# Patient Record
Sex: Female | Born: 1955 | Race: Black or African American | Hispanic: No | Marital: Married | Smoking: Never smoker
Health system: Southern US, Community
[De-identification: ages and names within clinical notes are randomized; demographics above are authoritative.]

## PROBLEM LIST (undated history)

## (undated) DIAGNOSIS — I10 Essential (primary) hypertension: Secondary | ICD-10-CM

---

## 2008-04-23 ENCOUNTER — Ambulatory Visit (HOSPITAL_COMMUNITY): Admission: RE | Admit: 2008-04-23 | Discharge: 2008-04-23 | Payer: Self-pay | Admitting: *Deleted

## 2015-07-14 ENCOUNTER — Other Ambulatory Visit (HOSPITAL_COMMUNITY): Payer: Self-pay | Admitting: Nurse Practitioner

## 2015-07-14 DIAGNOSIS — Z1231 Encounter for screening mammogram for malignant neoplasm of breast: Secondary | ICD-10-CM

## 2015-07-14 DIAGNOSIS — R102 Pelvic and perineal pain: Secondary | ICD-10-CM

## 2015-07-23 ENCOUNTER — Ambulatory Visit (HOSPITAL_COMMUNITY)
Admission: RE | Admit: 2015-07-23 | Discharge: 2015-07-23 | Disposition: A | Discharge status: Home / Self Care | Payer: BLUE CROSS/BLUE SHIELD | Source: Ambulatory Visit | Attending: Nurse Practitioner | Admitting: Nurse Practitioner

## 2015-07-23 ENCOUNTER — Ambulatory Visit (HOSPITAL_COMMUNITY): Payer: BLUE CROSS/BLUE SHIELD

## 2015-07-23 ENCOUNTER — Ambulatory Visit (HOSPITAL_COMMUNITY): Admission: RE | Admit: 2015-07-23 | Payer: BLUE CROSS/BLUE SHIELD | Source: Ambulatory Visit

## 2015-07-23 ENCOUNTER — Ambulatory Visit (HOSPITAL_COMMUNITY): Payer: Self-pay

## 2015-07-23 DIAGNOSIS — R102 Pelvic and perineal pain: Secondary | ICD-10-CM | POA: Diagnosis not present

## 2015-07-23 DIAGNOSIS — I77811 Abdominal aortic ectasia: Secondary | ICD-10-CM | POA: Insufficient documentation

## 2015-07-23 DIAGNOSIS — Z1231 Encounter for screening mammogram for malignant neoplasm of breast: Secondary | ICD-10-CM | POA: Diagnosis present

## 2015-07-23 DIAGNOSIS — Z90710 Acquired absence of both cervix and uterus: Secondary | ICD-10-CM | POA: Diagnosis not present

## 2015-07-23 DIAGNOSIS — R921 Mammographic calcification found on diagnostic imaging of breast: Secondary | ICD-10-CM | POA: Insufficient documentation

## 2015-07-27 ENCOUNTER — Other Ambulatory Visit: Payer: Self-pay | Admitting: Nurse Practitioner

## 2015-07-27 DIAGNOSIS — R928 Other abnormal and inconclusive findings on diagnostic imaging of breast: Secondary | ICD-10-CM

## 2015-08-20 ENCOUNTER — Ambulatory Visit
Admission: RE | Admit: 2015-08-20 | Discharge: 2015-08-20 | Disposition: A | Discharge status: Home / Self Care | Payer: BLUE CROSS/BLUE SHIELD | Source: Ambulatory Visit | Attending: Nurse Practitioner | Admitting: Nurse Practitioner

## 2015-08-20 ENCOUNTER — Other Ambulatory Visit: Payer: Self-pay | Admitting: Nurse Practitioner

## 2015-08-20 DIAGNOSIS — R928 Other abnormal and inconclusive findings on diagnostic imaging of breast: Secondary | ICD-10-CM

## 2017-10-17 ENCOUNTER — Encounter (HOSPITAL_COMMUNITY): Payer: Self-pay | Admitting: Emergency Medicine

## 2017-10-17 ENCOUNTER — Emergency Department (HOSPITAL_COMMUNITY): Payer: BLUE CROSS/BLUE SHIELD

## 2017-10-17 ENCOUNTER — Observation Stay (HOSPITAL_COMMUNITY)
Admission: EM | Admit: 2017-10-17 | Discharge: 2017-10-19 | Disposition: A | Discharge status: Home / Self Care | Payer: BLUE CROSS/BLUE SHIELD | Attending: Internal Medicine | Admitting: Internal Medicine

## 2017-10-17 ENCOUNTER — Other Ambulatory Visit: Payer: Self-pay

## 2017-10-17 DIAGNOSIS — I160 Hypertensive urgency: Principal | ICD-10-CM | POA: Insufficient documentation

## 2017-10-17 DIAGNOSIS — R209 Unspecified disturbances of skin sensation: Secondary | ICD-10-CM

## 2017-10-17 DIAGNOSIS — R42 Dizziness and giddiness: Secondary | ICD-10-CM | POA: Diagnosis present

## 2017-10-17 DIAGNOSIS — E876 Hypokalemia: Secondary | ICD-10-CM

## 2017-10-17 DIAGNOSIS — R112 Nausea with vomiting, unspecified: Secondary | ICD-10-CM | POA: Diagnosis not present

## 2017-10-17 DIAGNOSIS — R1110 Vomiting, unspecified: Secondary | ICD-10-CM

## 2017-10-17 DIAGNOSIS — E669 Obesity, unspecified: Secondary | ICD-10-CM | POA: Diagnosis not present

## 2017-10-17 DIAGNOSIS — R9431 Abnormal electrocardiogram [ECG] [EKG]: Secondary | ICD-10-CM | POA: Insufficient documentation

## 2017-10-17 DIAGNOSIS — Z6834 Body mass index (BMI) 34.0-34.9, adult: Secondary | ICD-10-CM | POA: Diagnosis not present

## 2017-10-17 DIAGNOSIS — I10 Essential (primary) hypertension: Secondary | ICD-10-CM

## 2017-10-17 DIAGNOSIS — I4891 Unspecified atrial fibrillation: Secondary | ICD-10-CM | POA: Insufficient documentation

## 2017-10-17 DIAGNOSIS — I161 Hypertensive emergency: Secondary | ICD-10-CM | POA: Diagnosis not present

## 2017-10-17 DIAGNOSIS — I480 Paroxysmal atrial fibrillation: Secondary | ICD-10-CM

## 2017-10-17 HISTORY — DX: Essential (primary) hypertension: I10

## 2017-10-17 LAB — MAGNESIUM: MAGNESIUM: 1.8 mg/dL (ref 1.7–2.4)

## 2017-10-17 LAB — I-STAT TROPONIN, ED: Troponin i, poc: 0.01 ng/mL (ref 0.00–0.08)

## 2017-10-17 LAB — DIFFERENTIAL
BASOS PCT: 0 %
Basophils Absolute: 0 10*3/uL (ref 0.0–0.1)
EOS ABS: 0.1 10*3/uL (ref 0.0–0.7)
EOS PCT: 1 %
Lymphocytes Relative: 26 %
Lymphs Abs: 2 10*3/uL (ref 0.7–4.0)
MONO ABS: 0.5 10*3/uL (ref 0.1–1.0)
MONOS PCT: 6 %
Neutro Abs: 5.1 10*3/uL (ref 1.7–7.7)
Neutrophils Relative %: 67 %

## 2017-10-17 LAB — CBC
HCT: 46 % (ref 36.0–46.0)
Hemoglobin: 14.3 g/dL (ref 12.0–15.0)
MCH: 29.3 pg (ref 26.0–34.0)
MCHC: 31.1 g/dL (ref 30.0–36.0)
MCV: 94.3 fL (ref 78.0–100.0)
PLATELETS: 226 10*3/uL (ref 150–400)
RBC: 4.88 MIL/uL (ref 3.87–5.11)
RDW: 13.8 % (ref 11.5–15.5)
WBC: 7.8 10*3/uL (ref 4.0–10.5)

## 2017-10-17 LAB — URINALYSIS, ROUTINE W REFLEX MICROSCOPIC
BILIRUBIN URINE: NEGATIVE
Glucose, UA: NEGATIVE mg/dL
HGB URINE DIPSTICK: NEGATIVE
Ketones, ur: NEGATIVE mg/dL
LEUKOCYTES UA: NEGATIVE
NITRITE: NEGATIVE
PH: 7 (ref 5.0–8.0)
Protein, ur: 100 mg/dL — AB
SPECIFIC GRAVITY, URINE: 1.01 (ref 1.005–1.030)

## 2017-10-17 LAB — COMPREHENSIVE METABOLIC PANEL
ALT: 15 U/L (ref 14–54)
AST: 20 U/L (ref 15–41)
Albumin: 3.9 g/dL (ref 3.5–5.0)
Alkaline Phosphatase: 74 U/L (ref 38–126)
Anion gap: 13 (ref 5–15)
BILIRUBIN TOTAL: 0.6 mg/dL (ref 0.3–1.2)
BUN: 8 mg/dL (ref 6–20)
CALCIUM: 9 mg/dL (ref 8.9–10.3)
CO2: 26 mmol/L (ref 22–32)
CREATININE: 0.64 mg/dL (ref 0.44–1.00)
Chloride: 101 mmol/L (ref 101–111)
GFR calc Af Amer: >60 mL/min (ref >60)
Glucose, Bld: 131 mg/dL — ABNORMAL HIGH (ref 65–99)
POTASSIUM: 2.7 mmol/L — AB (ref 3.5–5.1)
Sodium: 140 mmol/L (ref 135–145)
TOTAL PROTEIN: 7.4 g/dL (ref 6.5–8.1)

## 2017-10-17 LAB — PROTIME-INR
INR: 1.01
PROTHROMBIN TIME: 13.2 s (ref 11.4–15.2)

## 2017-10-17 LAB — LIPASE, BLOOD: Lipase: 30 U/L (ref 11–51)

## 2017-10-17 LAB — RAPID URINE DRUG SCREEN, HOSP PERFORMED
Amphetamines: NOT DETECTED
BARBITURATES: NOT DETECTED
Benzodiazepines: NOT DETECTED
COCAINE: NOT DETECTED
Opiates: NOT DETECTED
TETRAHYDROCANNABINOL: NOT DETECTED

## 2017-10-17 LAB — ETHANOL: Alcohol, Ethyl (B): <10 mg/dL (ref <10)

## 2017-10-17 LAB — TROPONIN I

## 2017-10-17 LAB — APTT: APTT: 26 s (ref 24–36)

## 2017-10-17 MED ORDER — LABETALOL HCL 5 MG/ML IV SOLN
20.0000 mg | INTRAVENOUS | Status: DC | PRN
  Administered 2017-10-17: 20 mg via INTRAVENOUS
  Filled 2017-10-17: qty 4

## 2017-10-17 MED ORDER — LOSARTAN POTASSIUM 50 MG PO TABS
50.0000 mg | ORAL_TABLET | Freq: Every day | ORAL | Status: DC
  Administered 2017-10-18 – 2017-10-19 (×2): 50 mg via ORAL
  Filled 2017-10-17 (×2): qty 1

## 2017-10-17 MED ORDER — LABETALOL HCL 5 MG/ML IV SOLN
0.5000 mg/min | INTRAVENOUS | Status: DC
  Administered 2017-10-17: 0.5 mg/min via INTRAVENOUS
  Filled 2017-10-17: qty 100

## 2017-10-17 MED ORDER — AMLODIPINE BESYLATE 5 MG PO TABS
5.0000 mg | ORAL_TABLET | Freq: Every day | ORAL | Status: DC
  Administered 2017-10-17 – 2017-10-18 (×2): 5 mg via ORAL
  Filled 2017-10-17 (×2): qty 1

## 2017-10-17 MED ORDER — ONDANSETRON HCL 4 MG/2ML IJ SOLN
4.0000 mg | Freq: Once | INTRAMUSCULAR | Status: AC
  Administered 2017-10-17: 4 mg via INTRAVENOUS

## 2017-10-17 MED ORDER — POTASSIUM CHLORIDE CRYS ER 20 MEQ PO TBCR
40.0000 meq | EXTENDED_RELEASE_TABLET | Freq: Once | ORAL | Status: AC
  Administered 2017-10-17: 40 meq via ORAL
  Filled 2017-10-17: qty 2

## 2017-10-17 MED ORDER — SODIUM CHLORIDE 0.9% FLUSH
3.0000 mL | Freq: Two times a day (BID) | INTRAVENOUS | Status: DC
  Administered 2017-10-17 – 2017-10-19 (×4): 3 mL via INTRAVENOUS

## 2017-10-17 MED ORDER — LOSARTAN POTASSIUM 50 MG PO TABS
100.0000 mg | ORAL_TABLET | Freq: Every day | ORAL | Status: DC
  Administered 2017-10-17: 100 mg via ORAL
  Filled 2017-10-17: qty 4

## 2017-10-17 MED ORDER — ACETAMINOPHEN 325 MG PO TABS: 650.0000 mg | ORAL_TABLET | Freq: Four times a day (QID) | ORAL | Status: DC | PRN

## 2017-10-17 MED ORDER — POTASSIUM CHLORIDE 10 MEQ/100ML IV SOLN
10.0000 meq | INTRAVENOUS | Status: AC
  Administered 2017-10-17 (×3): 10 meq via INTRAVENOUS
  Filled 2017-10-17 (×3): qty 100

## 2017-10-17 MED ORDER — ONDANSETRON HCL 4 MG/2ML IJ SOLN
INTRAMUSCULAR | Status: AC
  Administered 2017-10-17: 4 mg via INTRAVENOUS
  Filled 2017-10-17: qty 2

## 2017-10-17 MED ORDER — ONDANSETRON HCL 4 MG/2ML IJ SOLN
4.0000 mg | Freq: Once | INTRAMUSCULAR | Status: AC
  Administered 2017-10-17: 4 mg via INTRAVENOUS
  Filled 2017-10-17: qty 2

## 2017-10-17 MED ORDER — ACETAMINOPHEN 650 MG RE SUPP: 650.0000 mg | Freq: Four times a day (QID) | RECTAL | Status: DC | PRN

## 2017-10-17 MED ORDER — SODIUM CHLORIDE 0.9 % IV SOLN: 250.0000 mL | INTRAVENOUS | Status: DC | PRN

## 2017-10-17 MED ORDER — ONDANSETRON HCL 4 MG PO TABS: 4.0000 mg | ORAL_TABLET | Freq: Four times a day (QID) | ORAL | Status: DC | PRN

## 2017-10-17 MED ORDER — ENOXAPARIN SODIUM 60 MG/0.6ML ~~LOC~~ SOLN
50.0000 mg | SUBCUTANEOUS | Status: DC
  Administered 2017-10-17 – 2017-10-18 (×2): 50 mg via SUBCUTANEOUS
  Filled 2017-10-17 (×2): qty 0.6

## 2017-10-17 MED ORDER — SODIUM CHLORIDE 0.9% FLUSH: 3.0000 mL | INTRAVENOUS | Status: DC | PRN

## 2017-10-17 MED ORDER — ONDANSETRON HCL 4 MG/2ML IJ SOLN: 4.0000 mg | Freq: Four times a day (QID) | INTRAMUSCULAR | Status: DC | PRN

## 2017-10-17 NOTE — Progress Notes (Signed)
Pharmacy Clarification.  Lovenox 40 mg sq q24h ordered for DVT pxl in this 62 yo F with a BMI of 35.2 (based on height 37" and wt 102.5 kg).  Will change lovenox to 0.5 mg/kg q24h due to BMI >30.  Thanks,  [place] PharmD BCPS, BCPPS 10/17/2017 7:42 PM

## 2017-10-17 NOTE — ED Notes (Signed)
Date and time results received: 10/17/17 1140  Test: Potassium Critical Value: 2.7  Name of Provider Notified: Effie ShyWentz Orders Received? Or Actions Taken?: No new orders given.

## 2017-10-17 NOTE — ED Notes (Signed)
Pt states dizziness is some better. Family at bedside. EDP aware.

## 2017-10-17 NOTE — H&P (Addendum)
History and Physical    Lauren HaagensenMary G Oblinger WUX:[id number] DOB: 15-Mar-1956 DOA: 10/17/2017    PCP: Erasmo DownerStrader, Lindsey F, NP - NOTE: patient states she has no PCP Patient coming from: home Chief Complaint: dizziness and vomiting  HPI: Lauren Grimes is a 62 y.o. female with medical history of HTN for which she does not take medications presents with sudden onset dizziness and vomiting today. Last episode of vomiting was in the ER a little while ago. No abdominal pain and no diarrhea. No fevers. She states her dizziness has resolved in the ER as well. This is after she was started on a Labetalol infusion for severe HTN. She has has some numbness and tingling in both hands but no other neurological symptoms such as visual, speech issues or focal weakness. No headache. She was tole many years ago that she has HTN but does not have a PCP and does not take medications.   ED Course:  BP 230/127- improved to 150/83 on labetalol infusion K 2.7  Review of Systems:  All other systems reviewed and apart from HPI, are negative.  Past Medical History:  Diagnosis Date  . Hypertension     History reviewed. No pertinent surgical history.  Social History:   reports that  has never smoked. she has never used smokeless tobacco. She reports that she does not drink alcohol or use drugs.  Not on File  History reviewed. No pertinent family history.   Prior to Admission medications   Not on File    Physical Exam: Wt Readings from Last 3 Encounters:  10/17/17 102.5 kg (226 lb)   Vitals:   10/17/17 1545 10/17/17 1615 10/17/17 1645 10/17/17 1700  BP: (!) 162/83 (!) 159/87 (!) 168/88 (!) 159/92  Pulse: 77 81 79 82  Resp: 17 17 13 14   Temp:      SpO2: 94% 93% 96% 96%  Weight:      Height:          Constitutional: NAD, calm, comfortable Eyes: PERTLA, lids and conjunctivae normal ENMT: Mucous membranes are moist. Posterior pharynx clear of any exudate or lesions. Normal dentition.  Neck: normal,  supple, no masses, no thyromegaly Respiratory: clear to auscultation bilaterally, no wheezing, no crackles. Normal respiratory effort. No accessory muscle use.  Cardiovascular: S1 & S2 heard, regular rate and rhythm, no murmurs / rubs / gallops. No extremity edema. 2+ pedal pulses. No carotid bruits.  Abdomen: No distension, no tenderness, no masses palpated. No hepatosplenomegaly. Bowel sounds normal.  Musculoskeletal: no clubbing / cyanosis. No joint deformity upper and lower extremities. Good ROM, no contractures. Normal muscle tone.  Skin: no rashes, lesions, ulcers. No induration Neurologic: CN 2-12 grossly intact. Sensation intact, DTR normal. Strength 5/5 in all 4 limbs.  Psychiatric: Normal judgment and insight. Alert and oriented x 3. Normal mood.     Labs on Admission: I have personally reviewed following labs and imaging studies  CBC: Recent Labs  Lab 10/17/17 1054 10/17/17 1100  WBC 7.8  --   NEUTROABS  --  5.1  HGB 14.3  --   HCT 46.0  --   MCV 94.3  --   PLT 226  --    Basic Metabolic Panel: Recent Labs  Lab 10/17/17 1054 10/17/17 1654  NA 140  --   K 2.7*  --   CL 101  --   CO2 26  --   GLUCOSE 131*  --   BUN 8  --   CREATININE 0.64  --  CALCIUM 9.0  --   MG  --  1.8   GFR: Estimated Creatinine Clearance: 91.7 mL/min (by C-G formula based on SCr of 0.64 mg/dL). Liver Function Tests: Recent Labs  Lab 10/17/17 1054  AST 20  ALT 15  ALKPHOS 74  BILITOT 0.6  PROT 7.4  ALBUMIN 3.9   Recent Labs  Lab 10/17/17 1054  LIPASE 30   No results for input(s): AMMONIA in the last 168 hours. Coagulation Profile: Recent Labs  Lab 10/17/17 1100  INR 1.01   Cardiac Enzymes: No results for input(s): CKTOTAL, CKMB, CKMBINDEX, TROPONINI in the last 168 hours. BNP (last 3 results) No results for input(s): PROBNP in the last 8760 hours. HbA1C: No results for input(s): HGBA1C in the last 72 hours. CBG: No results for input(s): GLUCAP in the last 168  hours. Lipid Profile: No results for input(s): CHOL, HDL, LDLCALC, TRIG, CHOLHDL, LDLDIRECT in the last 72 hours. Thyroid Function Tests: No results for input(s): TSH, T4TOTAL, FREET4, T3FREE, THYROIDAB in the last 72 hours. Anemia Panel: No results for input(s): VITAMINB12, FOLATE, FERRITIN, TIBC, IRON, RETICCTPCT in the last 72 hours. Urine analysis:    Component Value Date/Time   COLORURINE STRAW (A) 10/17/2017 1040   APPEARANCEUR CLEAR 10/17/2017 1040   LABSPEC 1.010 10/17/2017 1040   PHURINE 7.0 10/17/2017 1040   GLUCOSEU NEGATIVE 10/17/2017 1040   HGBUR NEGATIVE 10/17/2017 1040   BILIRUBINUR NEGATIVE 10/17/2017 1040   KETONESUR NEGATIVE 10/17/2017 1040   PROTEINUR 100 (A) 10/17/2017 1040   NITRITE NEGATIVE 10/17/2017 1040   LEUKOCYTESUR NEGATIVE 10/17/2017 1040   Sepsis Labs: @LABRCNTIP (procalcitonin:4,lacticidven:4) )No results found for this or any previous visit (from the past 240 hour(s)).   Radiological Exams on Admission: Ct Head Wo Contrast  Result Date: 10/17/2017 CLINICAL DATA:  Dizziness and nausea beginning this morning. EXAM: CT HEAD WITHOUT CONTRAST TECHNIQUE: Contiguous axial images were obtained from the base of the skull through the vertex without intravenous contrast. COMPARISON:  None. FINDINGS: Brain: There is no evidence of acute infarct, intracranial hemorrhage, mass, midline shift, or extra-axial fluid collection. The ventricles and sulci are normal. Vascular: Calcified atherosclerosis at the skull base. No hyperdense vessel. Skull: No fracture or focal osseous lesion. Sinuses/Orbits: Mild left ethmoid air cell mucosal thickening. Clear mastoid air cells. Unremarkable orbits. Other: None. IMPRESSION: No evidence of acute intracranial abnormality. Unremarkable CT appearance of the brain. Electronically Signed   By: Sebastian Ache M.D.   On: 10/17/2017 12:12   Dg Chest Port 1 View  Result Date: 10/17/2017 CLINICAL DATA:  Hypertension with dizziness and  vomiting EXAM: PORTABLE CHEST 1 VIEW COMPARISON:  None. FINDINGS: The heart size and mediastinal contours are within normal limits. Both lungs are clear. The visualized skeletal structures are unremarkable. IMPRESSION: No active disease. Electronically Signed   By: Marlan Palau M.D.   On: 10/17/2017 11:29    EKG: Independently reviewed. NSR, t wave inversions in III, aVF, V4-V6  Assessment/Plan Principal Problem:   Hypertensive emergency - chronically untreated HTN - currently on a Labetalol infusion - will start Losartan and Norvasc and ask RN to wean Labetalol - goal BP should be about 160-180 and not below - have had a discussion with patient and husband about the complications of untreated HTN and the necessity to have a PCP and take medications for HTN appropriately - dietician consult  Active Problems:   Vomiting - due to HTN VS Viral - abdominal exam is benign-normal bowel sounds-  no diarrhea - PRN Zofran- full  liquids    Hypokalemia - ? Due to vomiting - replace and follow - Mg normal  Abnormal EKG - ? Due to HTN emergency/ cardiac strain  - no old EKG to compare with - first troponin is normal- will trend- vomiting can be an anginal equivalent  - will obtain an ECHO    Obesity -Body mass index is 34.87 kg/m. - check A1c as well    DVT prophylaxis: Lovenox  Code Status: DNR  Family Communication: husband  Disposition Plan: telemetry   Consults called: none  Admission status: observation    Calvert Cantor MD [facility] Pager: [email or web address] Password [email or web address] 7PM-7AM, please contact night-coverage   10/17/2017, 6:46 PM

## 2017-10-17 NOTE — ED Provider Notes (Signed)
[facility] EMERGENCY DEPARTMENT Provider Note   CSN: [id number] Arrival date & time: 10/17/17  0946     History   Chief Complaint Chief Complaint  Patient presents with  . Dizziness    HPI Lauren Grimes is a 62 y.o. female.  She awoke this morning with dizziness, and later vomited.  The dizziness is described as a spinning sensation.  She also had transient headache, which has resolved.  No head trauma.  No other preceding symptoms.  She states that she was well yesterday.  She has not taken her blood pressure medications in over 1 year.  She denies chest pain, shortness of breath, focal weakness or paresthesia.  She came here by private vehicle.  There are no other known modifying factors.  HPI  Past Medical History:  Diagnosis Date  . Hypertension     There are no active problems to display for this patient.   History reviewed. No pertinent surgical history.  OB History    No data available       Home Medications    Prior to Admission medications   Not on File    Family History History reviewed. No pertinent family history.  Social History Social History   Tobacco Use  . Smoking status: Never Smoker  . Smokeless tobacco: Never Used  Substance Use Topics  . Alcohol use: No    Frequency: Never  . Drug use: No     Allergies   Patient has no allergy information on record.   Review of Systems Review of Systems  All other systems reviewed and are negative.    Physical Exam Updated Vital Signs BP (!) 162/83   Pulse 77   Temp 98 F (36.7 C)   Resp 17   Ht 5' 7.5" (1.715 m)   Wt 102.5 kg (226 lb)   SpO2 94%   BMI 34.87 kg/m   Physical Exam  Constitutional: She is oriented to person, place, and time. She appears well-developed.  HENT:  Head: Normocephalic and atraumatic.  Eyes: Conjunctivae and EOM are normal. Pupils are equal, round, and reactive to light.  Neck: Normal range of motion and phonation normal. Neck supple.    Cardiovascular: Normal rate and regular rhythm.  Pulmonary/Chest: Effort normal and breath sounds normal. She exhibits no tenderness.  Abdominal: Soft. She exhibits no distension. There is no tenderness. There is no guarding.  Musculoskeletal: Normal range of motion. She exhibits edema (1+ bilateral lower legs).  Neurological: She is alert and oriented to person, place, and time. She exhibits normal muscle tone.  No dysarthria, or aphasia.  No ataxia.  Skin: Skin is warm and dry.  Psychiatric: She has a normal mood and affect. Her behavior is normal. Judgment and thought content normal.  Nursing note and vitals reviewed.    ED Treatments / Results  Labs (all labs ordered are listed, but only abnormal results are displayed) Labs Reviewed  COMPREHENSIVE METABOLIC PANEL - Abnormal; Notable for the following components:      Result Value   Potassium 2.7 (*)    Glucose, Bld 131 (*)    All other components within normal limits  URINALYSIS, ROUTINE W REFLEX MICROSCOPIC - Abnormal; Notable for the following components:   Color, Urine STRAW (*)    Protein, ur 100 (*)    Bacteria, UA RARE (*)    Squamous Epithelial / LPF 0-5 (*)    All other components within normal limits  LIPASE, BLOOD  CBC  ETHANOL  PROTIME-INR  APTT  DIFFERENTIAL  RAPID URINE DRUG SCREEN, HOSP PERFORMED  I-STAT TROPONIN, ED    EKG  EKG Interpretation  Date/Time:  Wednesday October 17 2017 15:23:56 EST Ventricular Rate:  81 PR Interval:    QRS Duration: 87 QT Interval:  394 QTC Calculation: 458 R Axis:   3 Text Interpretation:  Sinus rhythm Nonspecific T abnormalities, diffuse leads Borderline ST elevation, lateral leads Since last tracing rate slower and now in sinus rhythym, and nonspecific t wave abnormality Confirmed by Mancel Bale [phone]) on 10/17/2017 4:25:45 PM      This patients CHA2DS2-VASc Score and unadjusted Ischemic Stroke Rate (% per year) is equal to 2.2 % stroke rate/year from a score  of 2  Above score calculated as 1 point each if present [CHF, HTN, DM, Vascular=MI/PAD/Aortic Plaque, Age if 65-74, or Female] Above score calculated as 2 points each if present [Age > 75, or Stroke/TIA/TE]    Radiology Ct Head Wo Contrast  Result Date: 10/17/2017 CLINICAL DATA:  Dizziness and nausea beginning this morning. EXAM: CT HEAD WITHOUT CONTRAST TECHNIQUE: Contiguous axial images were obtained from the base of the skull through the vertex without intravenous contrast. COMPARISON:  None. FINDINGS: Brain: There is no evidence of acute infarct, intracranial hemorrhage, mass, midline shift, or extra-axial fluid collection. The ventricles and sulci are normal. Vascular: Calcified atherosclerosis at the skull base. No hyperdense vessel. Skull: No fracture or focal osseous lesion. Sinuses/Orbits: Mild left ethmoid air cell mucosal thickening. Clear mastoid air cells. Unremarkable orbits. Other: None. IMPRESSION: No evidence of acute intracranial abnormality. Unremarkable CT appearance of the brain. Electronically Signed   By: Sebastian Ache M.D.   On: 10/17/2017 12:12   Dg Chest Port 1 View  Result Date: 10/17/2017 CLINICAL DATA:  Hypertension with dizziness and vomiting EXAM: PORTABLE CHEST 1 VIEW COMPARISON:  None. FINDINGS: The heart size and mediastinal contours are within normal limits. Both lungs are clear. The visualized skeletal structures are unremarkable. IMPRESSION: No active disease. Electronically Signed   By: Marlan Palau M.D.   On: 10/17/2017 11:29    Procedures .Critical Care Performed by: Mancel Bale, MD Authorized by: Mancel Bale, MD   Critical care provider statement:    Critical care time (minutes):  95   Critical care start time:  10/17/2017 10:00 AM   Critical care end time:  10/17/2017 4:36 PM   Critical care was necessary to treat or prevent imminent or life-threatening deterioration of the following conditions:  Circulatory failure   Critical care was time  spent personally by me on the following activities:  Blood draw for specimens, development of treatment plan with patient or surrogate, discussions with consultants, obtaining history from patient or surrogate, examination of patient, evaluation of patient's response to treatment, ordering and performing treatments and interventions, ordering and review of laboratory studies, ordering and review of radiographic studies, pulse oximetry, re-evaluation of patient's condition and review of old charts   (including critical care time)  Medications Ordered in ED Medications  labetalol (NORMODYNE,TRANDATE) injection 20 mg (20 mg Intravenous Given 10/17/17 1140)  potassium chloride 10 mEq in 100 mL IVPB (10 mEq Intravenous New Bag/Given 10/17/17 1517)  ondansetron (ZOFRAN) injection 4 mg (4 mg Intravenous Given 10/17/17 1046)  potassium chloride SA (K-DUR,KLOR-CON) CR tablet 40 mEq (40 mEq Oral Given 10/17/17 1220)     Initial Impression / Assessment and Plan / ED Course  I have reviewed the triage vital signs and the nursing notes.  Pertinent labs & imaging  results that were available during my care of the patient were reviewed by me and considered in my medical decision making (see chart for details).  Clinical Course as of Oct 17 1634  Wed Oct 17, 2017  1052 She presents for evaluation of dizziness, which was first noticed this morning, and was associated with headache.  The dizziness made her vomit.  At this point the blood pressures improved to 173/90, prior 230/127.  Further treatment and evaluation ordered.  [EW]    Clinical Course User Index [EW] Mancel Bale, MD     Patient Vitals for the past 24 hrs:  BP Temp Pulse Resp SpO2 Height Weight  10/17/17 1545 (!) 162/83 - 77 17 94 % - -  10/17/17 1515 (!) 162/94 - 75 12 96 % - -  10/17/17 1430 (!) 146/87 - 77 14 96 % - -  10/17/17 1415 (!) 161/94 - 75 13 93 % - -  10/17/17 1345 (!) 151/95 - 80 15 98 % - -  10/17/17 1300 (!) 167/99 - (!) 55  13 95 % - -  10/17/17 1230 (!) 155/97 - 99 17 96 % - -  10/17/17 1218 (!) 156/104 - (!) 114 20 98 % - -  10/17/17 1215 - 98 F (36.7 C) - - - - -  10/17/17 1207 (!) 166/92 - 86 17 96 % - -  10/17/17 1145 137/89 - 71 11 94 % - -  10/17/17 1135 (!) 152/86 - - 17 - - -  10/17/17 1116 (!) 160/83 - 82 14 99 % - -  10/17/17 1100 (!) 168/96 - 100 15 96 % - -  10/17/17 1030 (!) 173/90 - 86 15 92 % - -  10/17/17 0958 - - - - - 5' 7.5" (1.715 m) 102.5 kg (226 lb)  10/17/17 0957 (!) 230/127 - (!) 104 18 95 % - -    4:27 PM Reevaluation with update and discussion. After initial assessment and treatment, an updated evaluation reveals patient states she feels better wants to go home however she is actively vomiting.  Encourage her to stay, and will contact the hospitalist for admission. Mancel Bale   4:37 PM-Consult complete with hospitalist. Patient case explained and discussed.  The agrees to admit patient for further evaluation and treatment. Call ended at 4:50 PM   Final Clinical Impressions(s) / ED Diagnoses   Final diagnoses:  Hypertensive urgency  Atrial fibrillation with RVR (HCC)  Hypokalemia   Hypertension, untreated, patient's choice.  New-onset atrial fibrillation, with version after treatment using labetalol.  Chads score elevated at 2.  She may need anticoagulants.  Evaluation consistent with hypertensive urgency, requiring hospitalization and further treatment, and the observed setting.  Nursing Notes Reviewed/ Care Coordinated Applicable Imaging Reviewed Interpretation of Laboratory Data incorporated into ED treatment   Plan: Admit  ED Discharge Orders    None       Mancel Bale, MD 10/17/17 1701

## 2017-10-17 NOTE — ED Notes (Signed)
Pt resting with husband at bedside. Waiting on hospitalist for admission.

## 2017-10-17 NOTE — ED Triage Notes (Signed)
Pt reports woke up this am around 800am with dizziness and emesis. Pt reports "room is spinning." pt denies any changes in vision, weakness, or numbness.

## 2017-10-17 NOTE — ED Notes (Signed)
Admitting doctor in room at this time. 

## 2017-10-18 ENCOUNTER — Observation Stay (HOSPITAL_COMMUNITY): Payer: BLUE CROSS/BLUE SHIELD

## 2017-10-18 ENCOUNTER — Observation Stay (HOSPITAL_BASED_OUTPATIENT_CLINIC_OR_DEPARTMENT_OTHER): Payer: BLUE CROSS/BLUE SHIELD

## 2017-10-18 DIAGNOSIS — I4891 Unspecified atrial fibrillation: Secondary | ICD-10-CM | POA: Diagnosis not present

## 2017-10-18 DIAGNOSIS — R209 Unspecified disturbances of skin sensation: Secondary | ICD-10-CM

## 2017-10-18 DIAGNOSIS — I10 Essential (primary) hypertension: Secondary | ICD-10-CM | POA: Diagnosis not present

## 2017-10-18 DIAGNOSIS — R9431 Abnormal electrocardiogram [ECG] [EKG]: Secondary | ICD-10-CM | POA: Diagnosis not present

## 2017-10-18 DIAGNOSIS — E876 Hypokalemia: Secondary | ICD-10-CM

## 2017-10-18 DIAGNOSIS — I160 Hypertensive urgency: Secondary | ICD-10-CM | POA: Diagnosis not present

## 2017-10-18 DIAGNOSIS — I161 Hypertensive emergency: Secondary | ICD-10-CM | POA: Diagnosis not present

## 2017-10-18 DIAGNOSIS — I480 Paroxysmal atrial fibrillation: Secondary | ICD-10-CM | POA: Diagnosis not present

## 2017-10-18 LAB — CBC
HEMATOCRIT: 41.7 % (ref 36.0–46.0)
HEMOGLOBIN: 12.8 g/dL (ref 12.0–15.0)
MCH: 28.8 pg (ref 26.0–34.0)
MCHC: 30.7 g/dL (ref 30.0–36.0)
MCV: 93.9 fL (ref 78.0–100.0)
Platelets: 223 10*3/uL (ref 150–400)
RBC: 4.44 MIL/uL (ref 3.87–5.11)
RDW: 14.1 % (ref 11.5–15.5)
WBC: 12 10*3/uL — ABNORMAL HIGH (ref 4.0–10.5)

## 2017-10-18 LAB — MAGNESIUM: Magnesium: 1.8 mg/dL (ref 1.7–2.4)

## 2017-10-18 LAB — BASIC METABOLIC PANEL
ANION GAP: 11 (ref 5–15)
BUN: 15 mg/dL (ref 6–20)
CO2: 26 mmol/L (ref 22–32)
Calcium: 9.1 mg/dL (ref 8.9–10.3)
Chloride: 103 mmol/L (ref 101–111)
Creatinine, Ser: 1.05 mg/dL — ABNORMAL HIGH (ref 0.44–1.00)
GFR calc Af Amer: >60 mL/min (ref >60)
GFR calc non Af Amer: 56 mL/min — ABNORMAL LOW (ref >60)
GLUCOSE: 98 mg/dL (ref 65–99)
POTASSIUM: 3.6 mmol/L (ref 3.5–5.1)
Sodium: 140 mmol/L (ref 135–145)

## 2017-10-18 LAB — ECHOCARDIOGRAM COMPLETE
Height: 67 in
Weight: 3649.6 oz

## 2017-10-18 LAB — TSH: TSH: 1.603 u[IU]/mL (ref 0.350–4.500)

## 2017-10-18 LAB — VITAMIN B12: VITAMIN B 12: 474 pg/mL (ref 180–914)

## 2017-10-18 LAB — TROPONIN I: Troponin I: <0.03 ng/mL (ref <0.03)

## 2017-10-18 LAB — HEMOGLOBIN A1C
HEMOGLOBIN A1C: 5 % (ref 4.8–5.6)
Mean Plasma Glucose: 96.8 mg/dL

## 2017-10-18 NOTE — Progress Notes (Addendum)
PROGRESS NOTE  Lauren HaagensenMary G Grimes ZOX:[id number] DOB: [birth year] DOA: 10/17/2017 PCP: Lauren DownerStrader, Lindsey F, NP  Brief History:  62 year old female with a history of hypertension not on any medications presented with acute onset of dizziness with associated nausea and vomiting that began on the morning of 10/17/2017 after she got up from bed to go to the bathroom.  In addition, the patient had some associated numbness and tingling in bilateral hands that lasted 3-5 seconds.  She denied any fevers, chills, chest pain, shortness of breath, headache, visual disturbance, focal extremity weakness, dysarthria.  There is not been any sick contacts.  She denies any abdominal pain, dysuria, hematuria.  The patient states that she had been previously diagnosed with hypertension by her primary care provider, but she quit taking her antihypertensive medications over 2 years ago when she ran out.  She has not followed up since that time.  In the emergency department, the patient was afebrile hemodynamically stable with a blood pressure of 230/107.  CT of the brain was unremarkable.  BMP and CBC were unremarkable except for potassium 2.7.  Assessment/Plan: Sensory disturbance -May be related to hypokalemia, vagal reaction -MRI brain--neg for stroke -CT brain negative -Serum B12 -TSH -Repeat potassium -check mag  Malignant hypertension/hypertensive urgency -Continue amlodipine and losartan which were started 10/17/2017  New Onset Atrial Fibrillation -spontaneously converted back to sinus on labetalol drip -Echo -consult cardiology  Abnormal EKG -Suspect a result of uncontrolled hypertension -Echocardiogram -personally reviewed EKG--sinus with T wave inversion V4-V6, 3, aVF  Nausea and vomiting -LFTs and lipase normal  -Urinalysis negative for pyuria -Resolved  Hypokalemia -Replete -Magnesium 1.8  Obesity -Body mass index is 34.87 kg/m.        Disposition Plan:   Home 1/17 or 1/18 if  stable Family Communication:   No Family at bedside--Total time spent 35 minutes.  Greater than 50% spent face to face counseling and coordinating care.   Consultants:  none  Code Status:  FULL  DVT Prophylaxis:  [place] Lovenox   Procedures: As Listed in Progress Note Above  Antibiotics: None    Subjective: Patient denies fevers, chills, headache, chest pain, dyspnea, nausea, vomiting, diarrhea, abdominal pain, dysuria, hematuria, hematochezia, and melena.   Objective: Vitals:   10/17/17 1945 10/17/17 2000 10/17/17 2036 10/17/17 2111  BP: (!) 165/85 (!) 154/82 (!) 147/76   Pulse: 78 77 78   Resp:  18    Temp:   99 Grimes (37.2 C)   TempSrc:   Oral   SpO2: 97% 98% 99% 92%  Weight:   103.5 kg (228 lb 1.6 oz)   Height:   5\' 7"  (1.702 m)     Intake/Output Summary (Last 24 hours) at 10/18/2017 0653 Last data filed at 10/18/2017 0300 Gross per 24 hour  Intake 175 ml  Output -  Net 175 ml   Weight change:  Exam:   General:  Pt is alert, follows commands appropriately, not in acute distress  HEENT: No icterus, No thrush, No neck mass, [place]/AT  Cardiovascular: RRR, S1/S2, no rubs, no gallops  Respiratory: CTA bilaterally, no wheezing, no crackles, no rhonchi  Abdomen: Soft/+BS, non tender, non distended, no guarding  Extremities: No edema, No lymphangitis, No petechiae, No rashes, no synovitis  -Neuro:  CN II-XII intact, strength 4/5 in RUE, RLE, strength 4/5 LUE, LLE; sensation intact bilateral; no dysmetria; babinski equivocal     Data Reviewed: I have personally reviewed following labs and  imaging studies Basic Metabolic Panel: Recent Labs  Lab 10/17/17 1054 10/17/17 1654  NA 140  --   K 2.7*  --   CL 101  --   CO2 26  --   GLUCOSE 131*  --   BUN 8  --   CREATININE 0.64  --   CALCIUM 9.0  --   MG  --  1.8   Liver Function Tests: Recent Labs  Lab 10/17/17 1054  AST 20  ALT 15  ALKPHOS 74  BILITOT 0.6  PROT 7.4  ALBUMIN 3.9   Recent Labs  Lab  10/17/17 1054  LIPASE 30   No results for input(s): AMMONIA in the last 168 hours. Coagulation Profile: Recent Labs  Lab 10/17/17 1100  INR 1.01   CBC: Recent Labs  Lab 10/17/17 1054 10/17/17 1100  WBC 7.8  --   NEUTROABS  --  5.1  HGB 14.3  --   HCT 46.0  --   MCV 94.3  --   PLT 226  --    Cardiac Enzymes: Recent Labs  Lab 10/17/17 1953 10/18/17 0056  TROPONINI <0.03 <0.03   BNP: Invalid input(s): POCBNP CBG: No results for input(s): GLUCAP in the last 168 hours. HbA1C: No results for input(s): HGBA1C in the last 72 hours. Urine analysis:    Component Value Date/Time   COLORURINE STRAW (A) 10/17/2017 1040   APPEARANCEUR CLEAR 10/17/2017 1040   LABSPEC 1.010 10/17/2017 1040   PHURINE 7.0 10/17/2017 1040   GLUCOSEU NEGATIVE 10/17/2017 1040   HGBUR NEGATIVE 10/17/2017 1040   BILIRUBINUR NEGATIVE 10/17/2017 1040   KETONESUR NEGATIVE 10/17/2017 1040   PROTEINUR 100 (A) 10/17/2017 1040   NITRITE NEGATIVE 10/17/2017 1040   LEUKOCYTESUR NEGATIVE 10/17/2017 1040   Sepsis Labs: @LABRCNTIP (procalcitonin:4,lacticidven:4) )No results found for this or any previous visit (from the past 240 hour(s)).   Scheduled Meds: . amLODipine  5 mg Oral Daily  . enoxaparin (LOVENOX) injection  50 mg Subcutaneous Q24H  . losartan  50 mg Oral Daily  . sodium chloride flush  3 mL Intravenous Q12H   Continuous Infusions: . sodium chloride    . labetalol (NORMODYNE) infusion 0.5 mg/min (10/17/17 1700)    Procedures/Studies: Ct Head Wo Contrast  Result Date: 10/17/2017 CLINICAL DATA:  Dizziness and nausea beginning this morning. EXAM: CT HEAD WITHOUT CONTRAST TECHNIQUE: Contiguous axial images were obtained from the base of the skull through the vertex without intravenous contrast. COMPARISON:  None. FINDINGS: Brain: There is no evidence of acute infarct, intracranial hemorrhage, mass, midline shift, or extra-axial fluid collection. The ventricles and sulci are normal. Vascular:  Calcified atherosclerosis at the skull base. No hyperdense vessel. Skull: No fracture or focal osseous lesion. Sinuses/Orbits: Mild left ethmoid air cell mucosal thickening. Clear mastoid air cells. Unremarkable orbits. Other: None. IMPRESSION: No evidence of acute intracranial abnormality. Unremarkable CT appearance of the brain. Electronically Signed   By: Sebastian Ache M.D.   On: 10/17/2017 12:12   Dg Chest Port 1 View  Result Date: 10/17/2017 CLINICAL DATA:  Hypertension with dizziness and vomiting EXAM: PORTABLE CHEST 1 VIEW COMPARISON:  None. FINDINGS: The heart size and mediastinal contours are within normal limits. Both lungs are clear. The visualized skeletal structures are unremarkable. IMPRESSION: No active disease. Electronically Signed   By: Marlan Palau M.D.   On: 10/17/2017 11:29    Catarina Hartshorn, DO  [facility] Pager [phone]  If 7PM-7AM, please contact night-coverage [email or web address] Password [email or web address] 10/18/2017, 6:53 AM   LOS: 0 days

## 2017-10-18 NOTE — Progress Notes (Signed)
*  PRELIMINARY RESULTS* Echocardiogram 2D Echocardiogram has been performed.  Jeryl Columbialliott, Accalia Rigdon 10/18/2017, 12:49 PM

## 2017-10-18 NOTE — Consult Note (Signed)
Cardiology Consultation:   Patient ID: Lauren Grimes; [id number]; 08/01/1956   Admit date: 10/17/2017 Date of Consult: 10/18/2017  Primary Care Provider: Erasmo Downer, NP Primary Cardiologist: New (Dr. Purvis Sheffield)    Patient Profile:   Lauren Grimes is a 62 y.o. female with a hx of hypertension who is being seen today for the evaluation of atrial fibrillation at the request of Dr. Arbutus Leas.  History of Present Illness:   Lauren Grimes is a 62 year old woman with a history of hypertension for which she does not take any medications.  She presented to the ED on 10/17/17 with a sudden onset of dizziness and vomiting.  She had no abdominal pain.  She denies chest pain and shortness of breath.  She was started on a labetalol infusion for severe hypertension.  She also had some numbness and tingling in both hands but no focal deficits.  She denied a headache.  Does not have a PCP.  Blood pressure on arrival was 230/127.  Losartan and amlodipine were started.  She had some hypokalemia as well.  ECG which I personally interpreted demonstrated sinus rhythm with asymmetric T wave inversions in leads III, aVF, and V4 through V6.  An ECG performed on 10/17/17 demonstrated atrial fibrillation, 106 bpm. This is not known to be a previous diagnosis.  I personally reviewed the echocardiogram performed today which demonstrated normal left ventricular systolic function and normal regional wall motion, LVEF 60-65%, mild LVH, grade 1 diastolic dysfunction with elevated left ventricular filling pressures, and mild left atrial dilatation.  Chest x-ray yesterday showed no active disease.  She also underwent a normal MRI of the brain today.  Troponins were normal.  TSH was also normal.  White blood cell count was mildly elevated at 12.  Creatinine was mildly elevated at 1.05.  Initial potassium was low at 2.7.  It is normal today at 3.6 after replacement.  She denies any history of chest pain, shortness of breath,  syncope, and palpitations.  She is feeling much better.   Past Medical History:  Diagnosis Date  . Hypertension     History reviewed. No pertinent surgical history.     Inpatient Medications: Scheduled Meds: . amLODipine  5 mg Oral Daily  . enoxaparin (LOVENOX) injection  50 mg Subcutaneous Q24H  . losartan  50 mg Oral Daily  . sodium chloride flush  3 mL Intravenous Q12H   Continuous Infusions: . sodium chloride    . labetalol (NORMODYNE) infusion 0.5 mg/min (10/17/17 1700)   PRN Meds: sodium chloride, acetaminophen **OR** acetaminophen, ondansetron **OR** ondansetron (ZOFRAN) IV, sodium chloride flush  Allergies:   No Known Allergies  Social History:   Social History   Socioeconomic History  . Marital status: Married    Spouse name: Not on file  . Number of children: Not on file  . Years of education: Not on file  . Highest education level: Not on file  Social Needs  . Financial resource strain: Not on file  . Food insecurity - worry: Not on file  . Food insecurity - inability: Not on file  . Transportation needs - medical: Not on file  . Transportation needs - non-medical: Not on file  Occupational History  . Not on file  Tobacco Use  . Smoking status: Never Smoker  . Smokeless tobacco: Never Used  Substance and Sexual Activity  . Alcohol use: No    Frequency: Never  . Drug use: No  . Sexual activity: Not on file  Other  Topics Concern  . Not on file  Social History Narrative  . Not on file    Family History:   No family h/o premature CAD.  ROS:  Please see the history of present illness.  ROS  All other ROS reviewed and negative.     Physical Exam/Data:   Vitals:   10/17/17 2000 10/17/17 2036 10/17/17 2111 10/18/17 0700  BP: (!) 154/82 (!) 147/76  (!) 146/56  Pulse: 77 78  76  Resp: 18   18  Temp:  99 F (37.2 C)  99 F (37.2 C)  TempSrc:  Oral  Oral  SpO2: 98% 99% 92% 95%  Weight:  228 lb 1.6 oz (103.5 kg)    Height:  5\' 7"  (1.702  m)      Intake/Output Summary (Last 24 hours) at 10/18/2017 1602 Last data filed at 10/18/2017 0956 Gross per 24 hour  Intake 78 ml  Output -  Net 78 ml   Filed Weights   10/17/17 0958 10/17/17 1920 10/17/17 2036  Weight: 226 lb (102.5 kg) 226 lb (102.5 kg) 228 lb 1.6 oz (103.5 kg)   Body mass index is 35.73 kg/m.  General:  Well nourished, well developed, in no acute distress HEENT: normal Lymph: no adenopathy Neck: no JVD Endocrine:  No thryomegaly Cardiac:  normal S1, S2; RRR; no murmur  Lungs:  clear to auscultation bilaterally, no wheezing, rhonchi or rales  Abd: soft, nontender, no hepatomegaly  Ext: no edema Musculoskeletal:  No deformities, BUE and BLE strength normal and equal Skin: warm and dry  Neuro:  CNs 2-12 intact, no focal abnormalities noted Psych:  Normal affect    Telemetry:  Telemetry was personally reviewed and demonstrates:  Sinus rhythm  Relevant CV Studies: Echo reviewed above.  Laboratory Data:  Chemistry Recent Labs  Lab 10/17/17 1054 10/18/17 0653  NA 140 140  K 2.7* 3.6  CL 101 103  CO2 26 26  GLUCOSE 131* 98  BUN 8 15  CREATININE 0.64 1.05*  CALCIUM 9.0 9.1  GFRNONAA >60 56*  GFRAA >60 >60  ANIONGAP 13 11    Recent Labs  Lab 10/17/17 1054  PROT 7.4  ALBUMIN 3.9  AST 20  ALT 15  ALKPHOS 74  BILITOT 0.6   Hematology Recent Labs  Lab 10/17/17 1054 10/18/17 0653  WBC 7.8 12.0*  RBC 4.88 4.44  HGB 14.3 12.8  HCT 46.0 41.7  MCV 94.3 93.9  MCH 29.3 28.8  MCHC 31.1 30.7  RDW 13.8 14.1  PLT 226 223   Cardiac Enzymes Recent Labs  Lab 10/17/17 1953 10/18/17 0056 10/18/17 0653  TROPONINI <0.03 <0.03 <0.03    Recent Labs  Lab 10/17/17 1121  TROPIPOC 0.01    BNPNo results for input(s): BNP, PROBNP in the last 168 hours.  DDimer No results for input(s): DDIMER in the last 168 hours.  Radiology/Studies:  Ct Head Wo Contrast  Result Date: 10/17/2017 CLINICAL DATA:  Dizziness and nausea beginning this morning.  EXAM: CT HEAD WITHOUT CONTRAST TECHNIQUE: Contiguous axial images were obtained from the base of the skull through the vertex without intravenous contrast. COMPARISON:  None. FINDINGS: Brain: There is no evidence of acute infarct, intracranial hemorrhage, mass, midline shift, or extra-axial fluid collection. The ventricles and sulci are normal. Vascular: Calcified atherosclerosis at the skull base. No hyperdense vessel. Skull: No fracture or focal osseous lesion. Sinuses/Orbits: Mild left ethmoid air cell mucosal thickening. Clear mastoid air cells. Unremarkable orbits. Other: None. IMPRESSION: No evidence of acute  intracranial abnormality. Unremarkable CT appearance of the brain. Electronically Signed   By: Sebastian AcheAllen  Grady M.D.   On: 10/17/2017 12:12   Mr Brain Wo Contrast  Result Date: 10/18/2017 CLINICAL DATA:  Nausea and dizziness over the last day. EXAM: MRI HEAD WITHOUT CONTRAST TECHNIQUE: Multiplanar, multiecho pulse sequences of the brain and surrounding structures were obtained without intravenous contrast. COMPARISON:  Head CT 10/17/2017 FINDINGS: Brain: Brain has normal appearance without evidence of malformation, atrophy, old or acute small or large vessel infarction, mass lesion, hemorrhage, hydrocephalus or extra-axial collection. Vascular: Major vessels at the base of the brain show flow. Venous sinuses appear patent. Skull and upper cervical spine: Normal. Sinuses/Orbits: Clear/normal. Other: None significant. IMPRESSION: Normal examination.  No evidence of old or recent ischemic insult. Electronically Signed   By: Paulina FusiMark  Shogry M.D.   On: 10/18/2017 08:24   Dg Chest Port 1 View  Result Date: 10/17/2017 CLINICAL DATA:  Hypertension with dizziness and vomiting EXAM: PORTABLE CHEST 1 VIEW COMPARISON:  None. FINDINGS: The heart size and mediastinal contours are within normal limits. Both lungs are clear. The visualized skeletal structures are unremarkable. IMPRESSION: No active disease.  Electronically Signed   By: Marlan Palauharles  Clark M.D.   On: 10/17/2017 11:29    Assessment and Plan:   1. New onset atrial fibrillation: This occurred in the context of hypertensive urgency and hypokalemia. There has been no recurrence. Potassium has been replaced and is now normal. BP is better controlled. Left atrium is mildly enlarged. I do not plan to initiate either AV nodal blocking agents or systemic anticoagulation at this time.  2. Hypertensive urgency: BP better controlled on losartan and amlodipine. I have emphasized the importance of establishing with primary care and taking her meds as prescribed.  3. Hypokalemia: K now normal.  4. Abnormal ECG: Likely due to hypertensive urgency and LV strain. She has mild LVH and grade 1 diastolic dysfunction with elevated filling pressures, indicative of hypertensive heart disease.  Disposition: Can be discharged from my standpoint. I will arrange outpatient follow up with me.   For questions or updates, please contact [facility] Please consult [email or web address] for contact info under Cardiology/STEMI.   Signed, Prentice DockerSuresh Dhiya Smits, MD  10/18/2017 4:02 PM

## 2017-10-19 DIAGNOSIS — I160 Hypertensive urgency: Secondary | ICD-10-CM | POA: Diagnosis not present

## 2017-10-19 DIAGNOSIS — I480 Paroxysmal atrial fibrillation: Secondary | ICD-10-CM | POA: Diagnosis not present

## 2017-10-19 DIAGNOSIS — E876 Hypokalemia: Secondary | ICD-10-CM | POA: Diagnosis not present

## 2017-10-19 DIAGNOSIS — I4891 Unspecified atrial fibrillation: Secondary | ICD-10-CM | POA: Diagnosis not present

## 2017-10-19 LAB — HIV ANTIBODY (ROUTINE TESTING W REFLEX): HIV SCREEN 4TH GENERATION: NONREACTIVE

## 2017-10-19 MED ORDER — AMLODIPINE BESYLATE 5 MG PO TABS
10.0000 mg | ORAL_TABLET | Freq: Every day | ORAL | Status: DC
  Administered 2017-10-19: 10 mg via ORAL
  Filled 2017-10-19: qty 2

## 2017-10-19 MED ORDER — AMLODIPINE BESYLATE 10 MG PO TABS: 10.0000 mg | ORAL_TABLET | Freq: Every day | ORAL | 2 refills | Status: DC

## 2017-10-19 MED ORDER — LOSARTAN POTASSIUM 50 MG PO TABS: 50.0000 mg | ORAL_TABLET | Freq: Every day | ORAL | 1 refills | Status: DC

## 2017-10-19 NOTE — Discharge Instructions (Signed)
How to Take Your Blood Pressure  Blood pressure is a measurement of how strongly your blood is pressing against the walls of your arteries. Arteries are blood vessels that carry blood from your heart throughout your body. Your health care provider takes your blood pressure at each office visit. You can also take your own blood pressure at home with a blood pressure machine. You may need to take your own blood pressure:   To confirm a diagnosis of high blood pressure (hypertension).   To monitor your blood pressure over time.   To make sure your blood pressure medicine is working.    Supplies needed:  To take your blood pressure, you will need a blood pressure machine. You can buy a blood pressure machine, or blood pressure monitor, at most drugstores or online. There are several types of home blood pressure monitors. When choosing one, consider the following:   Choose a monitor that has an arm cuff.   Choose a monitor that wraps snugly around your upper arm. You should be able to fit only one finger between your arm and the cuff.   Do not choose a monitor that measures your blood pressure from your wrist or finger.    Your health care provider can suggest a reliable monitor that will meet your needs.  How to prepare  To get the most accurate reading, avoid the following for 30 minutes before you check your blood pressure:   Drinking caffeine.   Drinking alcohol.   Eating.   Smoking.   Exercising.    Five minutes before you check your blood pressure:   Empty your bladder.   Sit quietly without talking in a dining chair, rather than in a soft couch or armchair.    How to take your blood pressure  To check your blood pressure, follow the instructions in the manual that came with your blood pressure monitor. If you have a digital blood pressure monitor, the instructions may be as follows:  1. Sit up straight.  2. Place your feet on the floor. Do not cross your ankles or legs.  3. Rest your left arm at the  level of your heart on a table or desk or on the arm of a chair.  4. Pull up your shirt sleeve.  5. Wrap the blood pressure cuff around the upper part of your left arm, 1 inch (2.5 cm) above your elbow. It is best to wrap the cuff around bare skin.  6. Fit the cuff snugly around your arm. You should be able to place only one finger between the cuff and your arm.  7. Position the cord inside the groove of your elbow.  8. Press the power button.  9. Sit quietly while the cuff inflates and deflates.  10. Read the digital reading on the monitor screen and write it down (record it).  11. Wait 2-3 minutes, then repeat the steps, starting at step 1.    What does my blood pressure reading mean?  A blood pressure reading consists of a higher number over a lower number. Ideally, your blood pressure should be below 120/80. The first ("top") number is called the systolic pressure. It is a measure of the pressure in your arteries as your heart beats. The second ("bottom") number is called the diastolic pressure. It is a measure of the pressure in your arteries as the heart relaxes.  Blood pressure is classified into four stages. The following are the stages for adults who do   not have a short-term serious illness or a chronic condition. Systolic pressure and diastolic pressure are measured in a unit called mm Hg.  Normal   Systolic pressure: below 120.   Diastolic pressure: below 80.  Elevated   Systolic pressure: 120-129.   Diastolic pressure: below 80.  Hypertension stage 1   Systolic pressure: 130-139.   Diastolic pressure: 80-89.  Hypertension stage 2   Systolic pressure: 140 or above.   Diastolic pressure: 90 or above.  You can have prehypertension or hypertension even if only the systolic or only the diastolic number in your reading is higher than normal.  Follow these instructions at home:   Check your blood pressure as often as recommended by your health care provider.   Take your monitor to the next appointment  with your health care provider to make sure:  ? That you are using it correctly.  ? That it provides accurate readings.   Be sure you understand what your goal blood pressure numbers are.   Tell your health care provider if you are having any side effects from blood pressure medicine.  Contact a health care provider if:   Your blood pressure is consistently high.  Get help right away if:   Your systolic blood pressure is higher than 180.   Your diastolic blood pressure is higher than 110.  This information is not intended to replace advice given to you by your health care provider. Make sure you discuss any questions you have with your health care provider.  Document Released: 02/25/2016 Document Revised: 05/09/2016 Document Reviewed: 02/25/2016  [employer]vier Interactive Patient Education  2018 [employer]

## 2017-10-19 NOTE — Discharge Summary (Signed)
Physician Discharge Summary  Lauren Grimes ZOX:[id number] DOB: 15-Nov-1955 DOA: 10/17/2017  PCP: Erasmo Downer, NP  Admit date: 10/17/2017 Discharge date: 10/19/2017  Admitted From: Home Disposition:  Home  Recommendations for Outpatient Follow-up:  1. Follow up with PCP in 1-2 weeks 2. Please obtain BMP/CBC in one week     Discharge Condition: Stable CODE STATUS:FULL Diet recommendation: Heart Healthy    Brief/Interim Summary: 62 year old female with a history of hypertension not on any medications presented with acute onset of dizziness with associated nausea and vomiting that began on the morning of 10/17/2017 after she got up from bed to go to the bathroom.  In addition, the patient had some associated numbness and tingling in bilateral hands that lasted 3-5 seconds.  She denied any fevers, chills, chest pain, shortness of breath, headache, visual disturbance, focal extremity weakness, dysarthria.  There is not been any sick contacts.  She denies any abdominal pain, dysuria, hematuria.  The patient states that she had been previously diagnosed with hypertension by her primary care provider, but she quit taking her antihypertensive medications over 2 years ago when she ran out.  She has not followed up since that time.  In the emergency department, the patient was afebrile hemodynamically stable with a blood pressure of 230/107.  CT of the brain was unremarkable.  BMP and CBC were unremarkable except for potassium 2.7.    Discharge Diagnoses:  Sensory disturbance -May be related to hypokalemia, vagal reaction -MRI brain--neg for stroke -CT brain negative -Serum B12--474 -TSH--1.603 -Repeat potassium--3.6 -check mag--1.8  Malignant hypertension/hypertensive urgency -Continue amlodipine and losartan which were started 10/17/2017 -Discharge home with losartan 50 mg daily, amlodipine 10 mg daily  New Onset Atrial Fibrillation -spontaneously converted back to sinus on  labetalol drip -Echo--EF 60-65%, grade 1 DD, no WMA -consult cardiology--no need to start anticoagulation or AV nodal blocking agents in the setting of identifiable hypokalemia and significant hypertension which are likely the cause of PAF  Abnormal EKG -Suspect a result of uncontrolled hypertension -Echocardiogram--as above -personally reviewed EKG--sinus with T wave inversion V4-V6, 3, aVF  Nausea and vomiting -LFTs and lipase normal  -Urinalysis negative for pyuria -Resolved  Hypokalemia -Replete -Magnesium 1.8  Obesity -Body mass index is 34.87 kg/m.    Discharge Instructions  Discharge Instructions    Diet - low sodium heart healthy   Complete by:  As directed    Increase activity slowly   Complete by:  As directed      Allergies as of 10/19/2017   No Known Allergies     Medication List    TAKE these medications   amLODipine 10 MG tablet Commonly known as:  NORVASC Take 1 tablet (10 mg total) by mouth daily. Start taking on:  10/20/2017   losartan 50 MG tablet Commonly known as:  COZAAR Take 1 tablet (50 mg total) by mouth daily.       No Known Allergies  Consultations:  cardiology   Procedures/Studies: Ct Head Wo Contrast  Result Date: 10/17/2017 CLINICAL DATA:  Dizziness and nausea beginning this morning. EXAM: CT HEAD WITHOUT CONTRAST TECHNIQUE: Contiguous axial images were obtained from the base of the skull through the vertex without intravenous contrast. COMPARISON:  None. FINDINGS: Brain: There is no evidence of acute infarct, intracranial hemorrhage, mass, midline shift, or extra-axial fluid collection. The ventricles and sulci are normal. Vascular: Calcified atherosclerosis at the skull base. No hyperdense vessel. Skull: No fracture or focal osseous lesion. Sinuses/Orbits: Mild left ethmoid air cell  mucosal thickening. Clear mastoid air cells. Unremarkable orbits. Other: None. IMPRESSION: No evidence of acute intracranial abnormality.  Unremarkable CT appearance of the brain. Electronically Signed   By: Sebastian Ache M.D.   On: 10/17/2017 12:12   Mr Brain Wo Contrast  Result Date: 10/18/2017 CLINICAL DATA:  Nausea and dizziness over the last day. EXAM: MRI HEAD WITHOUT CONTRAST TECHNIQUE: Multiplanar, multiecho pulse sequences of the brain and surrounding structures were obtained without intravenous contrast. COMPARISON:  Head CT 10/17/2017 FINDINGS: Brain: Brain has normal appearance without evidence of malformation, atrophy, old or acute small or large vessel infarction, mass lesion, hemorrhage, hydrocephalus or extra-axial collection. Vascular: Major vessels at the base of the brain show flow. Venous sinuses appear patent. Skull and upper cervical spine: Normal. Sinuses/Orbits: Clear/normal. Other: None significant. IMPRESSION: Normal examination.  No evidence of old or recent ischemic insult. Electronically Signed   By: Paulina Fusi M.D.   On: 10/18/2017 08:24   Dg Chest Port 1 View  Result Date: 10/17/2017 CLINICAL DATA:  Hypertension with dizziness and vomiting EXAM: PORTABLE CHEST 1 VIEW COMPARISON:  None. FINDINGS: The heart size and mediastinal contours are within normal limits. Both lungs are clear. The visualized skeletal structures are unremarkable. IMPRESSION: No active disease. Electronically Signed   By: Marlan Palau M.D.   On: 10/17/2017 11:29        Discharge Exam: Vitals:   10/18/17 2128 10/19/17 0457  BP:  (!) 147/69  Pulse:  78  Resp:  18  Temp:  99.2 F (37.3 C)  SpO2: 92% 95%   Vitals:   10/18/17 1500 10/18/17 2009 10/18/17 2128 10/19/17 0457  BP: (!) 158/69 (!) 144/62  (!) 147/69  Pulse: 72 77  78  Resp:  18  18  Temp: 98.4 F (36.9 C) 98.6 F (37 C)  99.2 F (37.3 C)  TempSrc: Oral Oral  Oral  SpO2: 96% 99% 92% 95%  Weight:      Height:        General: Pt is alert, awake, not in acute distress Cardiovascular: RRR, S1/S2 +, no rubs, no gallops Respiratory: CTA bilaterally, no  wheezing, no rhonchi Abdominal: Soft, NT, ND, bowel sounds + Extremities: no edema, no cyanosis   The results of significant diagnostics from this hospitalization (including imaging, microbiology, ancillary and laboratory) are listed below for reference.    Significant Diagnostic Studies: Ct Head Wo Contrast  Result Date: 10/17/2017 CLINICAL DATA:  Dizziness and nausea beginning this morning. EXAM: CT HEAD WITHOUT CONTRAST TECHNIQUE: Contiguous axial images were obtained from the base of the skull through the vertex without intravenous contrast. COMPARISON:  None. FINDINGS: Brain: There is no evidence of acute infarct, intracranial hemorrhage, mass, midline shift, or extra-axial fluid collection. The ventricles and sulci are normal. Vascular: Calcified atherosclerosis at the skull base. No hyperdense vessel. Skull: No fracture or focal osseous lesion. Sinuses/Orbits: Mild left ethmoid air cell mucosal thickening. Clear mastoid air cells. Unremarkable orbits. Other: None. IMPRESSION: No evidence of acute intracranial abnormality. Unremarkable CT appearance of the brain. Electronically Signed   By: Sebastian Ache M.D.   On: 10/17/2017 12:12   Mr Brain Wo Contrast  Result Date: 10/18/2017 CLINICAL DATA:  Nausea and dizziness over the last day. EXAM: MRI HEAD WITHOUT CONTRAST TECHNIQUE: Multiplanar, multiecho pulse sequences of the brain and surrounding structures were obtained without intravenous contrast. COMPARISON:  Head CT 10/17/2017 FINDINGS: Brain: Brain has normal appearance without evidence of malformation, atrophy, old or acute small or large vessel infarction,  mass lesion, hemorrhage, hydrocephalus or extra-axial collection. Vascular: Major vessels at the base of the brain show flow. Venous sinuses appear patent. Skull and upper cervical spine: Normal. Sinuses/Orbits: Clear/normal. Other: None significant. IMPRESSION: Normal examination.  No evidence of old or recent ischemic insult.  Electronically Signed   By: Paulina FusiMark  Shogry M.D.   On: 10/18/2017 08:24   Dg Chest Port 1 View  Result Date: 10/17/2017 CLINICAL DATA:  Hypertension with dizziness and vomiting EXAM: PORTABLE CHEST 1 VIEW COMPARISON:  None. FINDINGS: The heart size and mediastinal contours are within normal limits. Both lungs are clear. The visualized skeletal structures are unremarkable. IMPRESSION: No active disease. Electronically Signed   By: Marlan Palauharles  Clark M.D.   On: 10/17/2017 11:29     Microbiology: No results found for this or any previous visit (from the past 240 hour(s)).   Labs: Basic Metabolic Panel: Recent Labs  Lab 10/17/17 1054 10/17/17 1654 10/18/17 0653 10/18/17 0656  NA 140  --  140  --   K 2.7*  --  3.6  --   CL 101  --  103  --   CO2 26  --  26  --   GLUCOSE 131*  --  98  --   BUN 8  --  15  --   CREATININE 0.64  --  1.05*  --   CALCIUM 9.0  --  9.1  --   MG  --  1.8  --  1.8   Liver Function Tests: Recent Labs  Lab 10/17/17 1054  AST 20  ALT 15  ALKPHOS 74  BILITOT 0.6  PROT 7.4  ALBUMIN 3.9   Recent Labs  Lab 10/17/17 1054  LIPASE 30   No results for input(s): AMMONIA in the last 168 hours. CBC: Recent Labs  Lab 10/17/17 1054 10/17/17 1100 10/18/17 0653  WBC 7.8  --  12.0*  NEUTROABS  --  5.1  --   HGB 14.3  --  12.8  HCT 46.0  --  41.7  MCV 94.3  --  93.9  PLT 226  --  223   Cardiac Enzymes: Recent Labs  Lab 10/17/17 1953 10/18/17 0056 10/18/17 0653  TROPONINI <0.03 <0.03 <0.03   BNP: Invalid input(s): POCBNP CBG: No results for input(s): GLUCAP in the last 168 hours.  Time coordinating discharge:  Greater than 30 minutes  Signed:  Catarina Hartshornavid Ommie Degeorge, DO [facility] Pager: [phone] 10/19/2017, 8:49 AM

## 2019-02-17 IMAGING — CR DG CHEST 1V PORT
1 series · 1 of 1 positions shown · non-contrast
Comparison: None.

CLINICAL DATA: Hypertension with dizziness and vomiting

EXAM:
PORTABLE CHEST 1 VIEW

[portable]
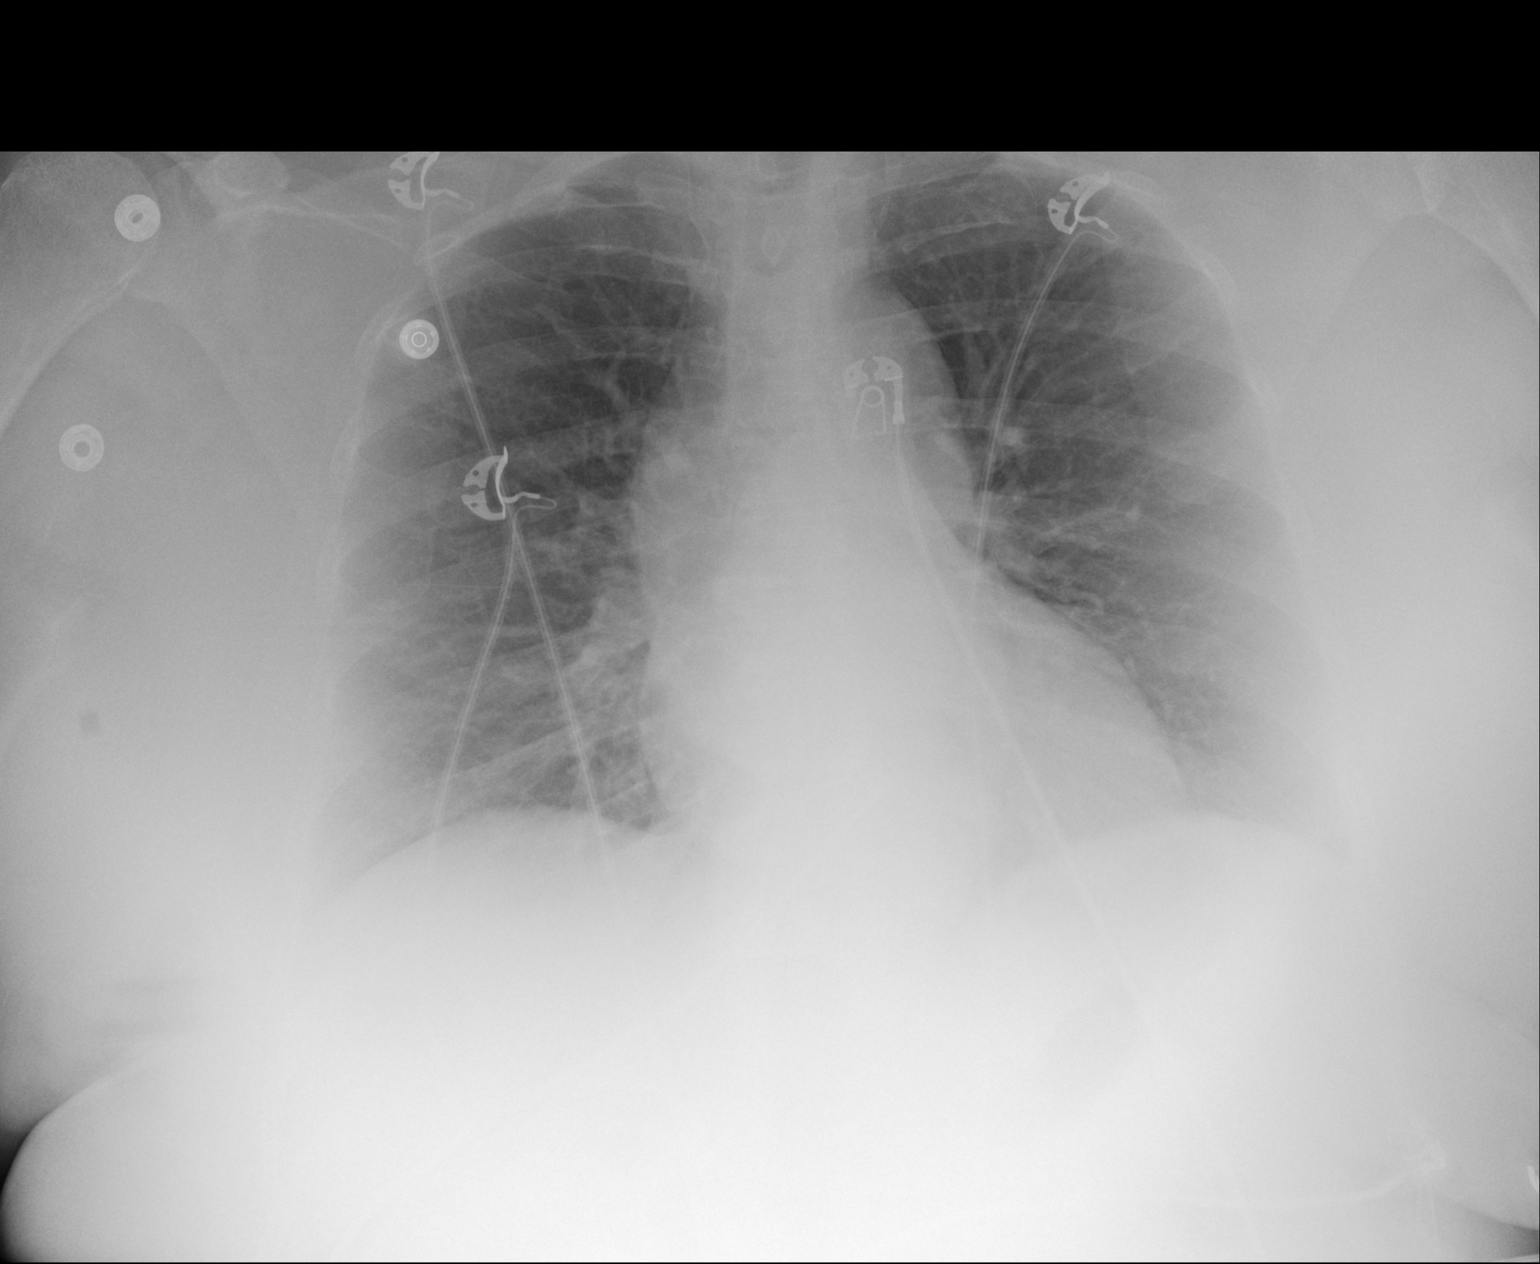

[1 of 1 positions shown; findings below may reference images not displayed]

FINDINGS: The heart size and mediastinal contours are within normal limits.
Both lungs are clear. The visualized skeletal structures are
unremarkable.
IMPRESSION: No active disease.

## 2019-02-17 IMAGING — CT CT HEAD W/O CM
3 series · 16 of 47 positions shown, 19 images · non-contrast
Comparison: None.

CLINICAL DATA: Dizziness and nausea beginning this morning.

EXAM:
CT HEAD WITHOUT CONTRAST
TECHNIQUE: Contiguous axial images were obtained from the base of the skull
through the vertex without intravenous contrast.

[Series 2: head trauma wo · axial · 0.40mm/px · z∈[+1610,+1735]mm · 10 of 30 slices shown, 13 images]
[im 3/30  brain]
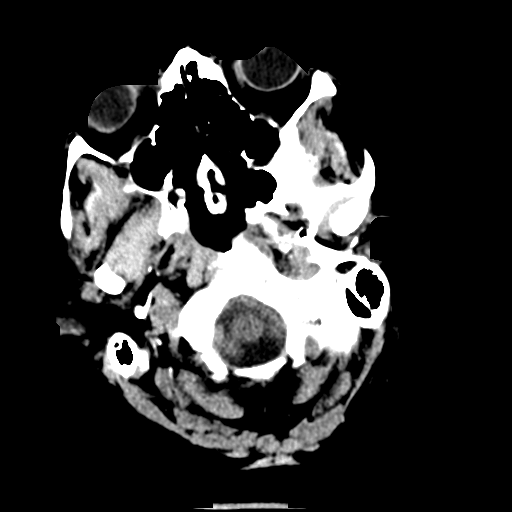
[im 3/30  bone]
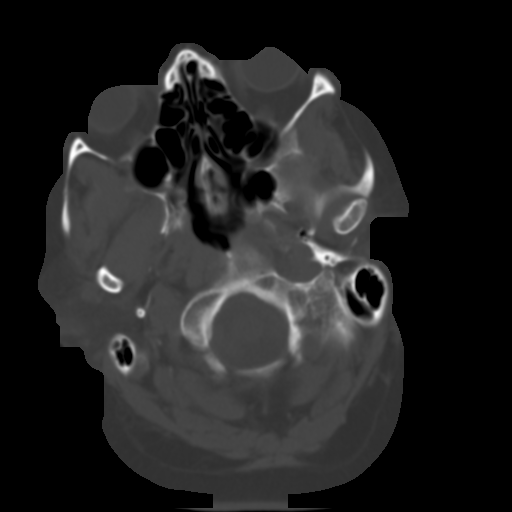
[im 6/30  brain]
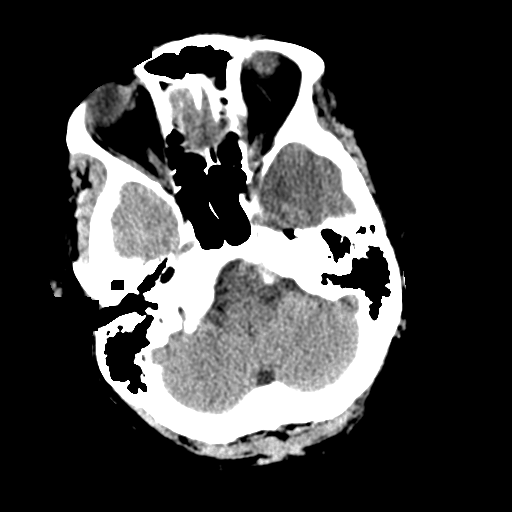
[im 9/30  brain]
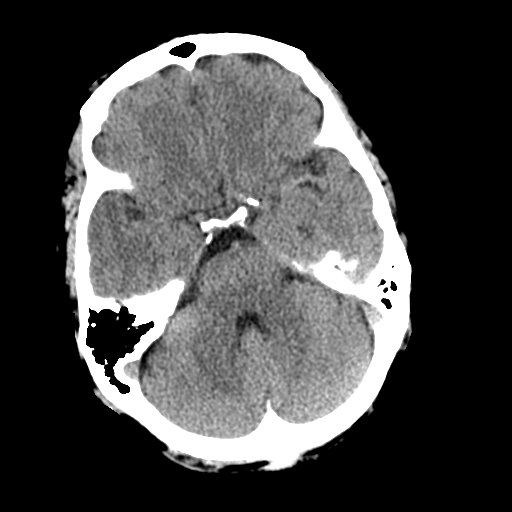
[im 11/30  brain]
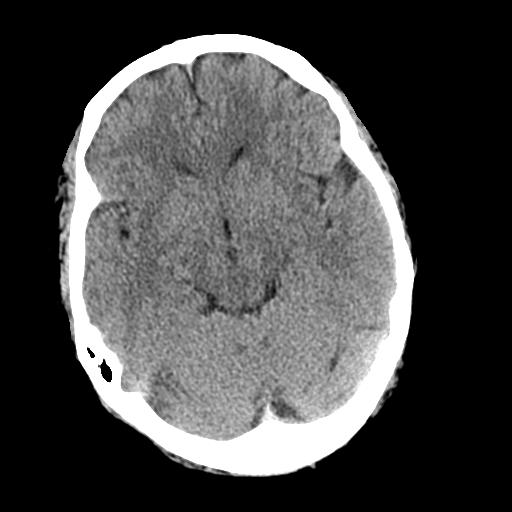
[im 14/30  brain]
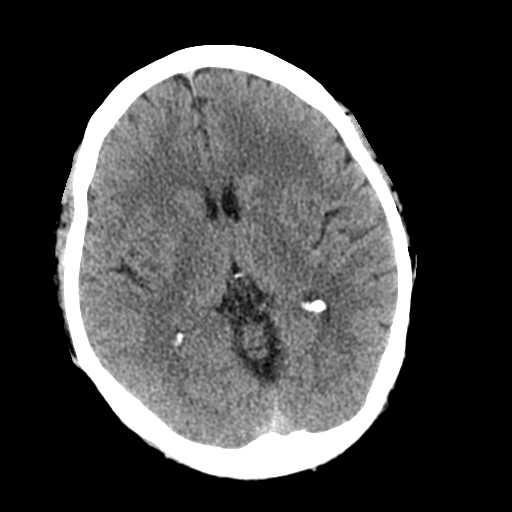
[im 14/30  bone]
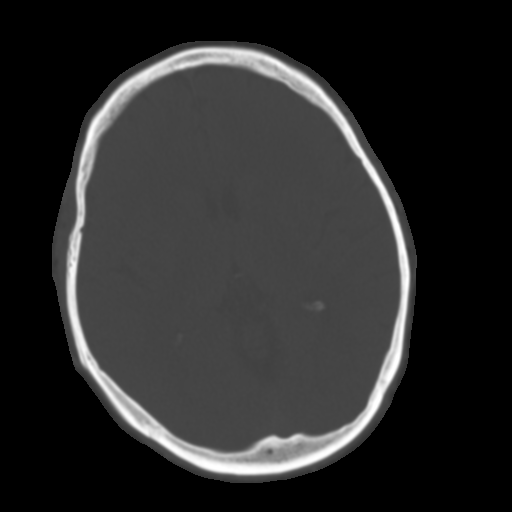
[im 17/30  brain]
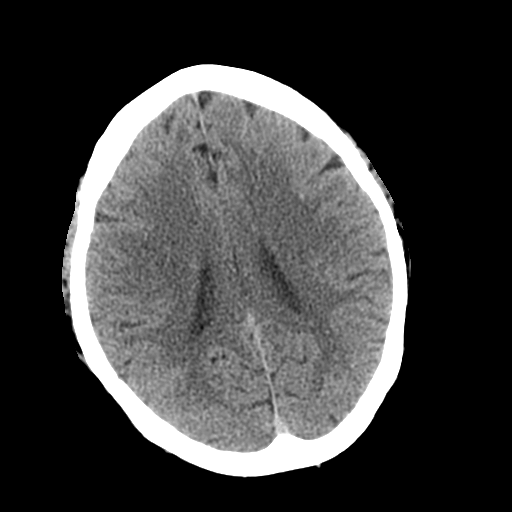
[im 20/30  brain]
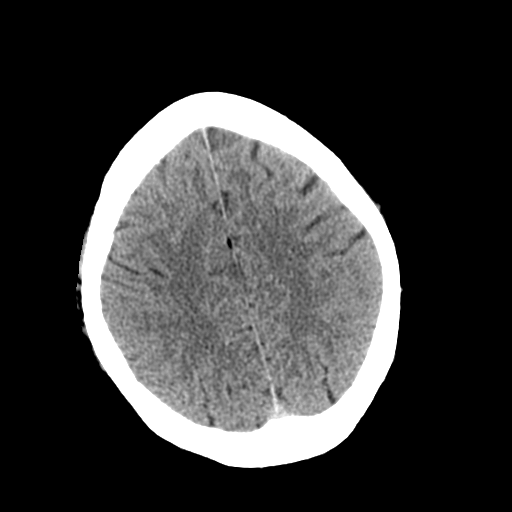
[im 23/30  brain]
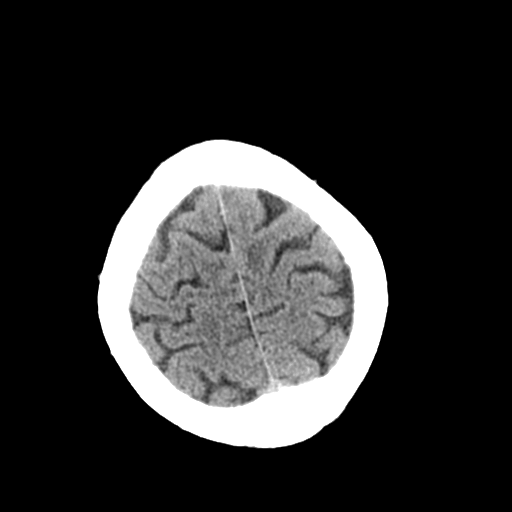
[im 25/30  brain]
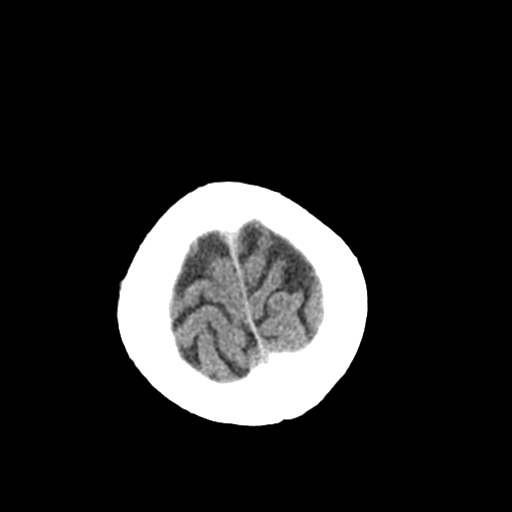
[im 25/30  bone]
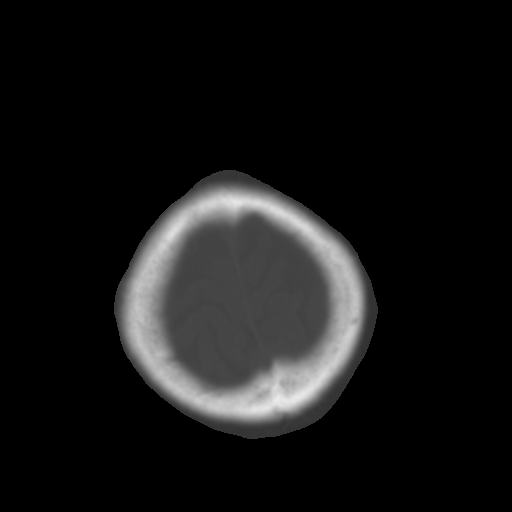
[im 28/30  brain]
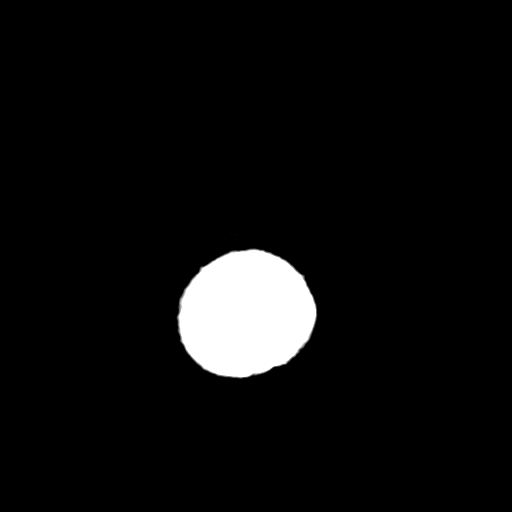

[Series 4: coronal soft tissue · coronal · 0.33mm/px · 3 of 67 slices shown]
[im 23/67  brain]
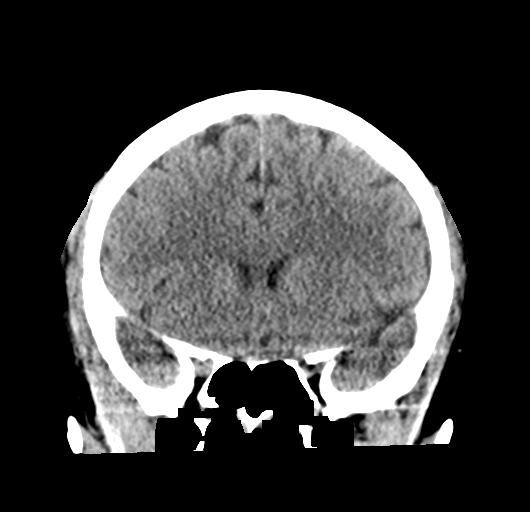
[im 30/67  brain]
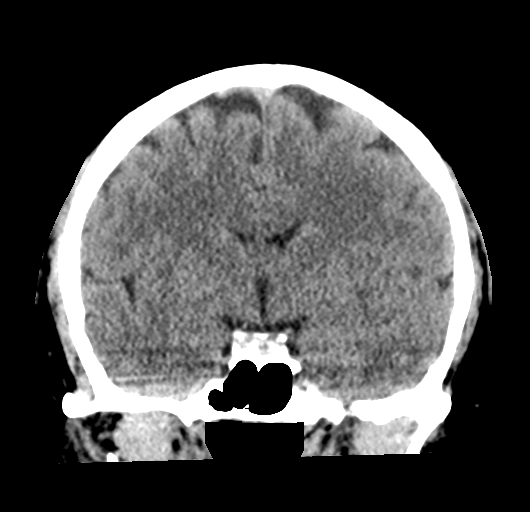
[im 37/67  brain]
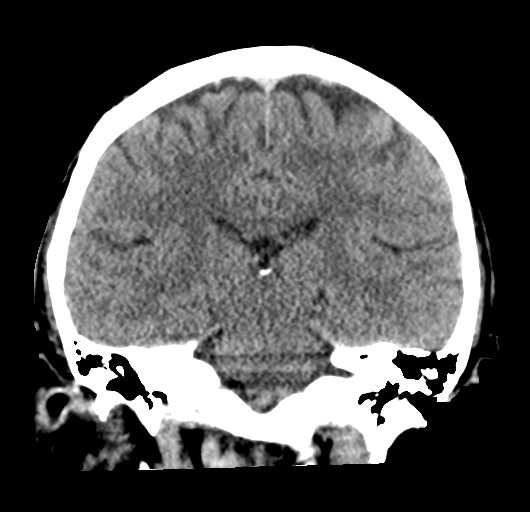

[Series 5: sagittal soft tissue · sagittal · 0.32mm/px · 3 of 58 slices shown]
[im 20/58  brain]
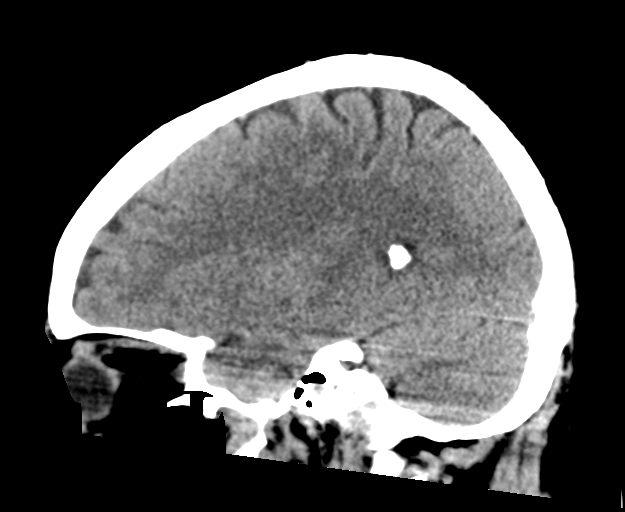
[im 29/58  brain]
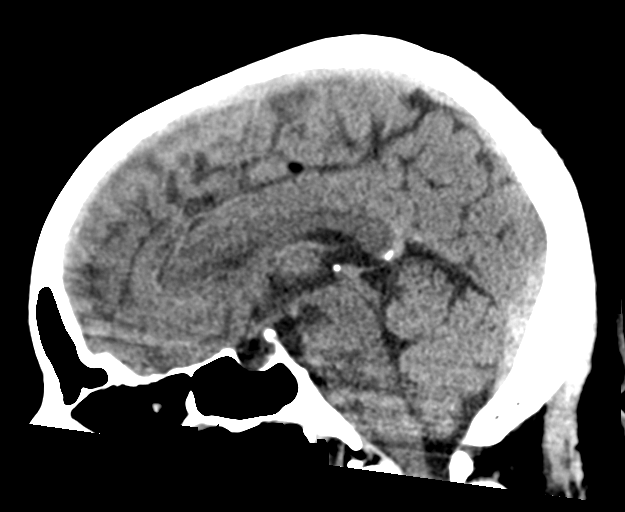
[im 39/58  brain]
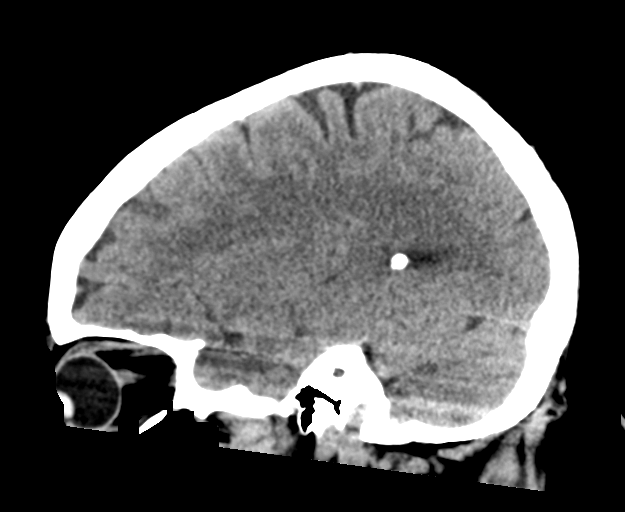

[16 of 47 positions shown; findings below may reference images not displayed]

FINDINGS: Brain: There is no evidence of acute infarct, intracranial
hemorrhage, mass, midline shift, or extra-axial fluid collection.
The ventricles and sulci are normal.

Vascular: Calcified atherosclerosis at the skull base. No hyperdense
vessel.

Skull: No fracture or focal osseous lesion.

Sinuses/Orbits: Mild left ethmoid air cell mucosal thickening. Clear
mastoid air cells. Unremarkable orbits.

Other: None.
IMPRESSION: No evidence of acute intracranial abnormality. Unremarkable CT
appearance of the brain.

## 2023-07-23 ENCOUNTER — Emergency Department (HOSPITAL_COMMUNITY): Payer: Medicare (Managed Care)

## 2023-07-23 ENCOUNTER — Other Ambulatory Visit: Payer: Self-pay

## 2023-07-23 ENCOUNTER — Encounter (HOSPITAL_COMMUNITY): Payer: Self-pay | Admitting: *Deleted

## 2023-07-23 ENCOUNTER — Inpatient Hospital Stay (HOSPITAL_COMMUNITY): Payer: Medicare (Managed Care)

## 2023-07-23 ENCOUNTER — Observation Stay (HOSPITAL_COMMUNITY)
Admission: EM | Admit: 2023-07-23 | Discharge: 2023-07-24 | Disposition: A | Discharge status: Home / Self Care | Payer: Medicare (Managed Care) | Attending: Internal Medicine | Admitting: Internal Medicine

## 2023-07-23 DIAGNOSIS — Z7902 Long term (current) use of antithrombotics/antiplatelets: Secondary | ICD-10-CM | POA: Diagnosis not present

## 2023-07-23 DIAGNOSIS — Z7982 Long term (current) use of aspirin: Secondary | ICD-10-CM | POA: Insufficient documentation

## 2023-07-23 DIAGNOSIS — R2689 Other abnormalities of gait and mobility: Secondary | ICD-10-CM | POA: Diagnosis not present

## 2023-07-23 DIAGNOSIS — M6281 Muscle weakness (generalized): Secondary | ICD-10-CM | POA: Insufficient documentation

## 2023-07-23 DIAGNOSIS — R2681 Unsteadiness on feet: Secondary | ICD-10-CM | POA: Diagnosis not present

## 2023-07-23 DIAGNOSIS — E876 Hypokalemia: Secondary | ICD-10-CM | POA: Diagnosis not present

## 2023-07-23 DIAGNOSIS — I639 Cerebral infarction, unspecified: Principal | ICD-10-CM | POA: Diagnosis present

## 2023-07-23 DIAGNOSIS — I10 Essential (primary) hypertension: Secondary | ICD-10-CM | POA: Insufficient documentation

## 2023-07-23 DIAGNOSIS — Z79899 Other long term (current) drug therapy: Secondary | ICD-10-CM | POA: Insufficient documentation

## 2023-07-23 DIAGNOSIS — R4701 Aphasia: Secondary | ICD-10-CM | POA: Diagnosis present

## 2023-07-23 LAB — DIFFERENTIAL
Abs Immature Granulocytes: 0.07 10*3/uL (ref 0.00–0.07)
Basophils Absolute: 0 10*3/uL (ref 0.0–0.1)
Basophils Relative: 0 %
Eosinophils Absolute: 0.1 10*3/uL (ref 0.0–0.5)
Eosinophils Relative: 1 %
Immature Granulocytes: 1 %
Lymphocytes Relative: 49 %
Lymphs Abs: 4.2 10*3/uL — ABNORMAL HIGH (ref 0.7–4.0)
Monocytes Absolute: 0.7 10*3/uL (ref 0.1–1.0)
Monocytes Relative: 8 %
Neutro Abs: 3.5 10*3/uL (ref 1.7–7.7)
Neutrophils Relative %: 41 %

## 2023-07-23 LAB — CBG MONITORING, ED: Glucose-Capillary: 90 mg/dL (ref 70–99)

## 2023-07-23 LAB — CBC
HCT: 45.4 % (ref 36.0–46.0)
Hemoglobin: 14 g/dL (ref 12.0–15.0)
MCH: 29.5 pg (ref 26.0–34.0)
MCHC: 30.8 g/dL (ref 30.0–36.0)
MCV: 95.6 fL (ref 80.0–100.0)
Platelets: 217 10*3/uL (ref 150–400)
RBC: 4.75 MIL/uL (ref 3.87–5.11)
RDW: 13.8 % (ref 11.5–15.5)
WBC: 8.3 10*3/uL (ref 4.0–10.5)
nRBC: 0 % (ref 0.0–0.2)

## 2023-07-23 LAB — URINALYSIS, ROUTINE W REFLEX MICROSCOPIC
Bilirubin Urine: NEGATIVE
Glucose, UA: NEGATIVE mg/dL
Hgb urine dipstick: NEGATIVE
Ketones, ur: NEGATIVE mg/dL
Leukocytes,Ua: NEGATIVE
Nitrite: NEGATIVE
Protein, ur: 100 mg/dL — AB
Specific Gravity, Urine: 1.01 (ref 1.005–1.030)
pH: 7 (ref 5.0–8.0)

## 2023-07-23 LAB — PROTIME-INR
INR: 1 (ref 0.8–1.2)
Prothrombin Time: 13.6 s (ref 11.4–15.2)

## 2023-07-23 LAB — RAPID URINE DRUG SCREEN, HOSP PERFORMED
Amphetamines: NOT DETECTED
Barbiturates: NOT DETECTED
Benzodiazepines: NOT DETECTED
Cocaine: NOT DETECTED
Opiates: NOT DETECTED
Tetrahydrocannabinol: NOT DETECTED

## 2023-07-23 LAB — COMPREHENSIVE METABOLIC PANEL
ALT: 17 U/L (ref 0–44)
AST: 17 U/L (ref 15–41)
Albumin: 4.1 g/dL (ref 3.5–5.0)
Alkaline Phosphatase: 73 U/L (ref 38–126)
Anion gap: 7 (ref 5–15)
BUN: 14 mg/dL (ref 8–23)
CO2: 27 mmol/L (ref 22–32)
Calcium: 8.8 mg/dL — ABNORMAL LOW (ref 8.9–10.3)
Chloride: 103 mmol/L (ref 98–111)
Creatinine, Ser: 0.81 mg/dL (ref 0.44–1.00)
GFR, Estimated: >60 mL/min (ref >60)
Glucose, Bld: 110 mg/dL — ABNORMAL HIGH (ref 70–99)
Potassium: 3.4 mmol/L — ABNORMAL LOW (ref 3.5–5.1)
Sodium: 137 mmol/L (ref 135–145)
Total Bilirubin: 0.7 mg/dL (ref 0.3–1.2)
Total Protein: 7.7 g/dL (ref 6.5–8.1)

## 2023-07-23 LAB — MAGNESIUM: Magnesium: 1.8 mg/dL (ref 1.7–2.4)

## 2023-07-23 LAB — APTT: aPTT: 25 s (ref 24–36)

## 2023-07-23 LAB — ETHANOL: Alcohol, Ethyl (B): <10 mg/dL (ref <10)

## 2023-07-23 MED ORDER — ENOXAPARIN SODIUM 40 MG/0.4ML IJ SOSY
40.0000 mg | PREFILLED_SYRINGE | INTRAMUSCULAR | Status: DC
  Administered 2023-07-23: 40 mg via SUBCUTANEOUS
  Filled 2023-07-23: qty 0.4

## 2023-07-23 MED ORDER — MELATONIN 3 MG PO TABS
3.0000 mg | ORAL_TABLET | Freq: Every evening | ORAL | Status: DC | PRN
  Filled 2023-07-23: qty 1

## 2023-07-23 MED ORDER — POTASSIUM CHLORIDE 20 MEQ PO PACK
40.0000 meq | PACK | Freq: Once | ORAL | Status: AC
  Administered 2023-07-23: 40 meq via ORAL
  Filled 2023-07-23: qty 2

## 2023-07-23 MED ORDER — STROKE: EARLY STAGES OF RECOVERY BOOK
Freq: Once | Status: AC
  Filled 2023-07-23: qty 1

## 2023-07-23 MED ORDER — ONDANSETRON HCL 4 MG/2ML IJ SOLN: 4.0000 mg | Freq: Four times a day (QID) | INTRAMUSCULAR | Status: DC | PRN

## 2023-07-23 MED ORDER — ASPIRIN 325 MG PO TABS: 325.0000 mg | ORAL_TABLET | Freq: Every day | ORAL | Status: DC

## 2023-07-23 MED ORDER — ASPIRIN 81 MG PO CHEW
81.0000 mg | CHEWABLE_TABLET | Freq: Every day | ORAL | Status: DC
  Administered 2023-07-24: 81 mg via ORAL
  Filled 2023-07-23: qty 1

## 2023-07-23 MED ORDER — HYDRALAZINE HCL 20 MG/ML IJ SOLN: 5.0000 mg | INTRAMUSCULAR | Status: DC | PRN

## 2023-07-23 MED ORDER — SORBITOL 70 % SOLN: 30.0000 mL | Freq: Every day | Status: DC | PRN

## 2023-07-23 MED ORDER — ASPIRIN 300 MG RE SUPP: 300.0000 mg | Freq: Every day | RECTAL | Status: DC

## 2023-07-23 MED ORDER — ASPIRIN 81 MG PO CHEW
324.0000 mg | CHEWABLE_TABLET | Freq: Once | ORAL | Status: AC
Start: 2023-07-23 — End: 2023-07-23
  Administered 2023-07-23: 324 mg via ORAL
  Filled 2023-07-23: qty 4

## 2023-07-23 MED ORDER — GADOBUTROL 1 MMOL/ML IV SOLN
10.0000 mL | Freq: Once | INTRAVENOUS | Status: AC | PRN
  Administered 2023-07-23: 10 mL via INTRAVENOUS

## 2023-07-23 MED ORDER — ACETAMINOPHEN 325 MG PO TABS: 650.0000 mg | ORAL_TABLET | Freq: Four times a day (QID) | ORAL | Status: DC | PRN

## 2023-07-23 MED ORDER — ONDANSETRON HCL 4 MG PO TABS: 4.0000 mg | ORAL_TABLET | Freq: Four times a day (QID) | ORAL | Status: DC | PRN

## 2023-07-23 MED ORDER — ACETAMINOPHEN 650 MG RE SUPP: 650.0000 mg | Freq: Four times a day (QID) | RECTAL | Status: DC | PRN

## 2023-07-23 MED ORDER — CLOPIDOGREL BISULFATE 75 MG PO TABS
75.0000 mg | ORAL_TABLET | Freq: Every day | ORAL | Status: DC
  Administered 2023-07-23 – 2023-07-24 (×2): 75 mg via ORAL
  Filled 2023-07-23 (×2): qty 1

## 2023-07-23 NOTE — ED Notes (Signed)
Tele-Neuro MD gave order to transport pt back to ED and then she will finish the NIH assessment at that time.

## 2023-07-23 NOTE — ED Notes (Signed)
Pt assisted to bathroom

## 2023-07-23 NOTE — Progress Notes (Signed)
Code stroke cart activated at 1230. Per bedside pt in route to CT.  Teleneurology paged at 1231.  Dr. Selina Cooley on screen at 1237.  Ricci Barker, Multimedia programmer

## 2023-07-23 NOTE — ED Notes (Signed)
ED TO INPATIENT HANDOFF REPORT  ED Nurse Name and Phone #: Birdie Hopes Name/Age/Gender Lauren Grimes 67 y.o. female Room/Bed: APA11/APA11  Code Status   Code Status: Full Code  Home/SNF/Other Home Patient oriented to: self, place, time, and situation Is this baseline? Yes   Triage Complete: Triage complete  Chief Complaint Stroke [facility]) [I63.9]  Triage Note Pt c/o right side weakness and slurred speech that started at 10:30 today  Pt states she has had some trouble with balance    Allergies No Known Allergies  Level of Care/Admitting Diagnosis ED Disposition     ED Disposition  Admit   Condition  --   Comment  Hospital Area: [facility] [[id number]]  Level of Care: Telemetry [5]  Covid Evaluation: Confirmed COVID Negative  Diagnosis: Stroke [facility]) [[id number]]  Admitting Physician: Buena Irish [[id number]]  Attending Physician: Buena Irish [phone]  Certification:: I certify this patient will need inpatient services for at least 2 midnights  Expected Medical Readiness: 07/25/2023          B Medical/Surgery History Past Medical History:  Diagnosis Date   Hypertension    History reviewed. No pertinent surgical history.   A IV Location/Drains/Wounds Patient Lines/Drains/Airways Status     Active Line/Drains/Airways     None            Intake/Output Last 24 hours No intake or output data in the 24 hours ending 07/23/23 1618  Labs/Imaging Results for orders placed or performed during the hospital encounter of 07/23/23 (from the past 48 hour(s))  CBG monitoring, ED     Status: None   Collection Time: 07/23/23 12:27 PM  Result Value Ref Range   Glucose-Capillary 90 70 - 99 mg/dL    Comment: Glucose reference range applies only to samples taken after fasting for at least 8 hours.  Protime-INR     Status: None   Collection Time: 07/23/23 12:29 PM  Result Value Ref Range   Prothrombin Time 13.6 11.4 - 15.2 seconds   INR 1.0 0.8 - 1.2     Comment: (NOTE) INR goal varies based on device and disease states. Performed at [facility], [address], [place], [place] [address]   APTT     Status: None   Collection Time: 07/23/23 12:29 PM  Result Value Ref Range   aPTT 25 24 - 36 seconds    Comment: Performed at [facility], [address] [place], [place] [address]  CBC     Status: None   Collection Time: 07/23/23 12:29 PM  Result Value Ref Range   WBC 8.3 4.0 - 10.5 K/uL   RBC 4.75 3.87 - 5.11 MIL/uL   Hemoglobin 14.0 12.0 - 15.0 g/dL   HCT 86.5 78.4 - 69.6 %   MCV 95.6 80.0 - 100.0 fL   MCH 29.5 26.0 - 34.0 pg   MCHC 30.8 30.0 - 36.0 g/dL   RDW 29.5 28.4 - 13.2 %   Platelets 217 150 - 400 K/uL   nRBC 0.0 0.0 - 0.2 %    Comment: Performed at [facility], [address], [place], [place] [address]  Comprehensive metabolic panel     Status: Abnormal   Collection Time: 07/23/23 12:29 PM  Result Value Ref Range   Sodium 137 135 - 145 mmol/L   Potassium 3.4 (L) 3.5 - 5.1 mmol/L   Chloride 103 98 - 111 mmol/L   CO2 27 22 - 32 mmol/L   Glucose, Bld 110 (H) 70 -  99 mg/dL    Comment: Glucose reference range applies only to samples taken after fasting for at least 8 hours.   BUN 14 8 - 23 mg/dL   Creatinine, Ser 7.84 0.44 - 1.00 mg/dL   Calcium 8.8 (L) 8.9 - 10.3 mg/dL   Total Protein 7.7 6.5 - 8.1 g/dL   Albumin 4.1 3.5 - 5.0 g/dL   AST 17 15 - 41 U/L   ALT 17 0 - 44 U/L   Alkaline Phosphatase 73 38 - 126 U/L   Total Bilirubin 0.7 0.3 - 1.2 mg/dL   GFR, Estimated >69 >62 mL/min    Comment: (NOTE) Calculated using the CKD-EPI Creatinine Equation (2021)    Anion gap 7 5 - 15    Comment: Performed at [facility], [address], [place], [place] [address]  Differential     Status: Abnormal   Collection Time: 07/23/23 12:29 PM  Result Value Ref Range   Neutrophils Relative % 41 %   Neutro Abs 3.5 1.7 - 7.7 K/uL   Lymphocytes Relative 49 %   Lymphs Abs 4.2 (H) 0.7 - 4.0 K/uL   Monocytes Relative 8 %   Monocytes  Absolute 0.7 0.1 - 1.0 K/uL   Eosinophils Relative 1 %   Eosinophils Absolute 0.1 0.0 - 0.5 K/uL   Basophils Relative 0 %   Basophils Absolute 0.0 0.0 - 0.1 K/uL   Immature Granulocytes 1 %   Abs Immature Granulocytes 0.07 0.00 - 0.07 K/uL    Comment: Performed at [facility], [address] [place], [place] [address]  Ethanol     Status: None   Collection Time: 07/23/23 12:52 PM  Result Value Ref Range   Alcohol, Ethyl (B) <10 <10 mg/dL    Comment: (NOTE) Lowest detectable limit for serum alcohol is 10 mg/dL.  For medical purposes only. Performed at [facility], [address], [place], [place] [address]   Urine rapid drug screen (hosp performed)     Status: None   Collection Time: 07/23/23 12:59 PM  Result Value Ref Range   Opiates NONE DETECTED NONE DETECTED   Cocaine NONE DETECTED NONE DETECTED   Benzodiazepines NONE DETECTED NONE DETECTED   Amphetamines NONE DETECTED NONE DETECTED   Tetrahydrocannabinol NONE DETECTED NONE DETECTED   Barbiturates NONE DETECTED NONE DETECTED    Comment: (NOTE) DRUG SCREEN FOR MEDICAL PURPOSES ONLY.  IF CONFIRMATION IS NEEDED FOR ANY PURPOSE, NOTIFY LAB WITHIN 5 DAYS.  LOWEST DETECTABLE LIMITS FOR URINE DRUG SCREEN Drug Class                     Cutoff (ng/mL) Amphetamine and metabolites    1000 Barbiturate and metabolites    200 Benzodiazepine                 200 Opiates and metabolites        300 Cocaine and metabolites        300 THC                            50 Performed at [facility], [address] [place], [place] [address]   Urinalysis, Routine w reflex microscopic -Urine, Clean Catch     Status: Abnormal   Collection Time: 07/23/23 12:59 PM  Result Value Ref Range   Color, Urine YELLOW YELLOW   APPearance CLEAR CLEAR   Specific Gravity, Urine 1.010 1.005 - 1.030   pH 7.0 5.0 -  8.0   Glucose, UA NEGATIVE NEGATIVE mg/dL   Hgb urine dipstick NEGATIVE NEGATIVE   Bilirubin Urine NEGATIVE NEGATIVE   Ketones, ur NEGATIVE  NEGATIVE mg/dL   Protein, ur 528 (A) NEGATIVE mg/dL   Nitrite NEGATIVE NEGATIVE   Leukocytes,Ua NEGATIVE NEGATIVE   RBC / HPF 0-5 0 - 5 RBC/hpf   WBC, UA 0-5 0 - 5 WBC/hpf   Bacteria, UA RARE (A) NONE SEEN   Squamous Epithelial / HPF 0-5 0 - 5 /HPF   Mucus PRESENT     Comment: Performed at [facility], [address] [place], [place] [address]   CT HEAD CODE STROKE WO CONTRAST  Result Date: 07/23/2023 CLINICAL DATA:  Code stroke.  Right-sided weakness EXAM: CT HEAD WITHOUT CONTRAST TECHNIQUE: Contiguous axial images were obtained from the base of the skull through the vertex without intravenous contrast. RADIATION DOSE REDUCTION: This exam was performed according to the departmental dose-optimization program which includes automated exposure control, adjustment of the mA and/or kV according to patient size and/or use of iterative reconstruction technique. COMPARISON:  Brain MRI 10/18/2017 FINDINGS: Brain: There is no acute intracranial hemorrhage, extra-axial fluid collection, or acute territorial infarct. Parenchymal volume is normal. The ventricles are normal in size. Gray-white differentiation is preserved The pituitary and suprasellar region are normal. There is no mass lesion. There is no mass effect or midline shift. Vascular: There is calcification of the bilateral carotid siphons. No dense vessel is seen. Skull: Normal. Negative for fracture or focal lesion. Sinuses/Orbits: The imaged paranasal sinuses are clear. The globes and orbits are unremarkable. Other: The mastoid air cells and middle ear cavities are clear. ASPECTS [facility] Stroke Program Early CT Score) - Ganglionic level infarction (caudate, lentiform nuclei, internal capsule, insula, M1-M3 cortex): 7 - Supraganglionic infarction (M4-M6 cortex): 3 Total score (0-10 with 10 being normal): 10 IMPRESSION: 1. No acute intracranial pathology. 2. ASPECTS is 10 These results were called by telephone at the time of interpretation on  07/23/2023 at 12:44 pm to provider Pricilla Loveless , who verbally acknowledged these results. Electronically Signed   By: Lesia Hausen M.D.   On: 07/23/2023 12:48    Pending Labs Unresulted Labs (From admission, onward)     Start     Ordered   07/24/23 0500  Lipid panel  (Labs)  Tomorrow morning,   R       Comments: Fasting    07/23/23 1523   07/24/23 0500  Hemoglobin A1c  (Labs)  Tomorrow morning,   R       Comments: To assess prior glycemic control    07/23/23 1523   07/24/23 0500  TSH  Tomorrow morning,   R        07/23/23 1523   07/24/23 0500  Basic metabolic panel  Tomorrow morning,   R        07/23/23 1523   07/24/23 0500  CBC  Tomorrow morning,   R        07/23/23 1523   07/24/23 0500  Magnesium  Tomorrow morning,   R        07/23/23 1523   07/23/23 1516  HIV Antibody (routine testing w rflx)  (HIV Antibody (Routine testing w reflex) panel)  Once,   R        07/23/23 1523            Vitals/Pain Today's Vitals   07/23/23 1454 07/23/23 1500 07/23/23 1515 07/23/23 1530  BP: (!) 206/90 (!) 182/96 (!) 184/92 (!) 198/95  Pulse: 84 91 90   Resp: (!) 23 (!) 22 20 (!) 22  Temp:  98.4 F (36.9 C)    TempSrc:  Oral    SpO2: 96% 97% 95%   Weight:      Height:      PainSc:        Isolation Precautions No active isolations  Medications Medications   stroke: early stages of recovery book (has no administration in time range)  enoxaparin (LOVENOX) injection 40 mg (has no administration in time range)  acetaminophen (TYLENOL) tablet 650 mg (has no administration in time range)    Or  acetaminophen (TYLENOL) suppository 650 mg (has no administration in time range)  sorbitol 70 % solution 30 mL (has no administration in time range)  ondansetron (ZOFRAN) tablet 4 mg (has no administration in time range)    Or  ondansetron (ZOFRAN) injection 4 mg (has no administration in time range)  melatonin tablet 3 mg (has no administration in time range)  clopidogrel (PLAVIX)  tablet 75 mg (75 mg Oral Given 07/23/23 1555)  aspirin chewable tablet 81 mg (has no administration in time range)  aspirin chewable tablet 324 mg (324 mg Oral Given 07/23/23 1522)    Mobility manual wheelchair     Focused Assessments Neuro Assessment Handoff:  Swallow screen pass? Yes    NIH Stroke Scale  Dizziness Present: No Headache Present: No Interval: Other (Comment) Level of Consciousness (1a.)   : Alert, keenly responsive LOC Questions (1b. )   : Answers both questions correctly LOC Commands (1c. )   : Performs both tasks correctly Best Gaze (2. )  : Normal Visual (3. )  : No visual loss Facial Palsy (4. )    : Minor paralysis Motor Arm, Left (5a. )   : No drift Motor Arm, Right (5b. ) : No drift Motor Leg, Left (6a. )  : No drift Motor Leg, Right (6b. ) : No drift Limb Ataxia (7. ): Absent Sensory (8. )  : Normal, no sensory loss Best Language (9. )  : Mild-to-moderate aphasia Dysarthria (10. ): Mild-to-moderate dysarthria, patient slurs at least some words and, at worst, can be understood with some difficulty Extinction/Inattention (11.)   : No Abnormality Complete NIHSS TOTAL: 3 Last date known well: 07/23/23 Last time known well: 1019 Neuro Assessment:   Neuro Checks:   Initial (07/23/23 1248)  Has TPA been given? No If patient is a Neuro Trauma and patient is going to OR before floor call report to 4N Charge nurse: [phone] or [phone]   R Recommendations: See Admitting Provider Note  Report given to:   Additional Notes: NIH 3

## 2023-07-23 NOTE — ED Notes (Signed)
Pt assisted to bathroom and back to bed 

## 2023-07-23 NOTE — ED Provider Notes (Signed)
[facility] DEPARTMENT AT [facility] Provider Note   CSN: [id number] Arrival date & time: 07/23/23  1215     History  Chief Complaint  Patient presents with   Aphasia    Lauren Grimes is a 67 y.o. female.  HPI 67 year old female presents with acute speech change and right sided weakness.  Per her report, she woke up feeling normal this morning.  At around 10 AM she started noticing right sided weakness in her arms and legs, and difficulty speaking.  Her speech has been slurred.  There is no headache.  She denies any current medical problems and has never had a stroke before.  She feels like her symptoms are improving but the speech is still abnormal right now.  Home Medications Prior to Admission medications   Medication Sig Start Date End Date Taking? Authorizing Provider  amLODipine (NORVASC) 10 MG tablet Take 1 tablet (10 mg total) by mouth daily. 10/20/17   Catarina Hartshorn, MD  losartan (COZAAR) 50 MG tablet Take 1 tablet (50 mg total) by mouth daily. 10/19/17   Catarina Hartshorn, MD      Allergies    Patient has no known allergies.    Review of Systems   Review of Systems  Neurological:  Positive for speech difficulty and weakness. Negative for headaches.    Physical Exam Updated Vital Signs BP (!) 182/96   Pulse 91   Temp 98.4 F (36.9 C) (Oral)   Resp (!) 22   Ht 5' 7.5" (1.715 m)   Wt 103.4 kg   SpO2 97%   BMI 35.18 kg/m  Physical Exam Vitals and nursing note reviewed.  Constitutional:      General: She is not in acute distress.    Appearance: She is well-developed. She is not ill-appearing or diaphoretic.  HENT:     Head: Normocephalic and atraumatic.  Cardiovascular:     Rate and Rhythm: Normal rate and regular rhythm.     Heart sounds: Normal heart sounds.  Pulmonary:     Effort: Pulmonary effort is normal.  Abdominal:     General: There is no distension.  Skin:    General: Skin is warm and dry.  Neurological:     Mental Status: She is  alert.     Comments: Mildly slurred speech. No facial droop. 5/5 strength in all 4 extremities.      ED Results / Procedures / Treatments   Labs (all labs ordered are listed, but only abnormal results are displayed) Labs Reviewed  COMPREHENSIVE METABOLIC PANEL - Abnormal; Notable for the following components:      Result Value   Potassium 3.4 (*)    Glucose, Bld 110 (*)    Calcium 8.8 (*)    All other components within normal limits  URINALYSIS, ROUTINE W REFLEX MICROSCOPIC - Abnormal; Notable for the following components:   Protein, ur 100 (*)    Bacteria, UA RARE (*)    All other components within normal limits  DIFFERENTIAL - Abnormal; Notable for the following components:   Lymphs Abs 4.2 (*)    All other components within normal limits  ETHANOL  PROTIME-INR  APTT  CBC  RAPID URINE DRUG SCREEN, HOSP PERFORMED  CBG MONITORING, ED  I-STAT CHEM 8, ED    EKG EKG Interpretation Date/Time:  Monday July 23 2023 12:49:58 EDT Ventricular Rate:  90 PR Interval:  230 QRS Duration:  96 QT Interval:  382 QTC Calculation: 468 R Axis:   -  15  Text Interpretation: Sinus rhythm Prolonged PR interval Borderline left axis deviation Baseline wander in lead(s) V1 nonspecific changes, similar to earlier in the day Confirmed by Pricilla Loveless [phone]) on 07/23/2023 12:59:17 PM  Radiology CT HEAD CODE STROKE WO CONTRAST  Result Date: 07/23/2023 CLINICAL DATA:  Code stroke.  Right-sided weakness EXAM: CT HEAD WITHOUT CONTRAST TECHNIQUE: Contiguous axial images were obtained from the base of the skull through the vertex without intravenous contrast. RADIATION DOSE REDUCTION: This exam was performed according to the departmental dose-optimization program which includes automated exposure control, adjustment of the mA and/or kV according to patient size and/or use of iterative reconstruction technique. COMPARISON:  Brain MRI 10/18/2017 FINDINGS: Brain: There is no acute intracranial  hemorrhage, extra-axial fluid collection, or acute territorial infarct. Parenchymal volume is normal. The ventricles are normal in size. Gray-white differentiation is preserved The pituitary and suprasellar region are normal. There is no mass lesion. There is no mass effect or midline shift. Vascular: There is calcification of the bilateral carotid siphons. No dense vessel is seen. Skull: Normal. Negative for fracture or focal lesion. Sinuses/Orbits: The imaged paranasal sinuses are clear. The globes and orbits are unremarkable. Other: The mastoid air cells and middle ear cavities are clear. ASPECTS [facility] Stroke Program Early CT Score) - Ganglionic level infarction (caudate, lentiform nuclei, internal capsule, insula, M1-M3 cortex): 7 - Supraganglionic infarction (M4-M6 cortex): 3 Total score (0-10 with 10 being normal): 10 IMPRESSION: 1. No acute intracranial pathology. 2. ASPECTS is 10 These results were called by telephone at the time of interpretation on 07/23/2023 at 12:44 pm to provider Pricilla Loveless , who verbally acknowledged these results. Electronically Signed   By: Lesia Hausen M.D.   On: 07/23/2023 12:48    Procedures Procedures    Medications Ordered in ED Medications  aspirin chewable tablet 324 mg (has no administration in time range)    ED Course/ Medical Decision Making/ A&P Clinical Course as of 07/23/23 1505  Mon Jul 23, 2023  1435 I went to reassess patient and she had developed recurrent symptoms, just started. Discussed with Dr. Selina Cooley, will call code stroke  and she will eval. [SG]    Clinical Course User Index [SG] Pricilla Loveless, MD                                 Medical Decision Making Amount and/or Complexity of Data Reviewed Labs: ordered.    Details: Minimal hypokalemia Radiology: ordered.    Details: No head bleed ECG/medicine tests: ordered and independent interpretation performed.    Details: No ischemia.  Risk OTC drugs. Decision regarding  hospitalization.   Patient presents within the code stroke window as above.  Code stroke called but her symptoms were relatively mild and neuro decided not to administer TNK.  However she was to be observed until 3 PM, when her code stroke window would close.  Patient developed new trouble speaking and so a code stroke was called again and Dr. Selina Cooley reevaluated her.  It is unclear if she truly got back to normal and so Dr. Selina Cooley is advising to hold off on TN K as the symptoms are still relatively mild.  Asked for her to be admitted here at [facility] for stroke workup.  Asks for full dose aspirin and permissive hypertension. Discussed with Dr. Gasper Sells for admission.        Final Clinical Impression(s) / ED Diagnoses Final diagnoses:  Acute ischemic stroke [facility])    Rx / DC Orders ED Discharge Orders     None         Pricilla Loveless, MD 07/23/23 [phone]

## 2023-07-23 NOTE — ED Triage Notes (Signed)
Pt c/o right side weakness and slurred speech that started at 10:30 today  Pt states she has had some trouble with balance

## 2023-07-23 NOTE — ED Notes (Signed)
Tele-Neuro MD gave order to do NIH and vital signs every 30 minutes until 1500 when pt is outside the TNK window.

## 2023-07-23 NOTE — Consult Note (Signed)
NEUROLOGY TELECONSULTATION NOTE   Date of service: July 23, 2023 Patient Name: Lauren Grimes MRN:  [id number] DOB:  Oct 20, 1955 Reason for consult: telestroke  Requesting Provider: Dr. Pricilla Loveless Consult Participants: myself, patient, bedside RN, telestroke RN Location of the provider: [facility] Location of the patient: [facility]  This consult was provided via telemedicine with 2-way video and audio communication. The patient/family was informed that care would be provided in this way and agreed to receive care in this manner.   _ _ _   _ __   _ __ _ _  __ __   _ __   __ _  History of Present Illness   This is a 67 year old woman with a past medical history significant for hypertension who presents with right-sided facial droop and slurred speech that started at 1030 today.  At baseline she is no neurologic deficits.  NIH stroke scale was 2 for facial droop and slurred speech.  CT head showed no acute process on personal review.  Risks, benefits, and alternatives to TNK were discussed with patient.  I recommended not giving TNK due to her symptoms being too mild to treat.  CTA was not performed as part of the stroke code bc exam was not c/w LVO. The plan at that time was to monitor her in the ED until 1500 at which time she would be outside the TNK window and would be admitted to the hospitalist service for stroke workup if her exam was stable or improved.  EDP called me at 1430 reporting that patient's slurred speech had improved but then worsened again so I got back on camera to examine the patient.  At that time her stroke scale was 2 and her exam was exactly the same as it was for my prior video exam therefore I gave the same recommendation that I did not recommend treatment with TNK because her symptoms were too mild to treat.  Patient was in agreement with this.  At this time she is now outside of the TNK window and admission for stroke workup is recommended.   ROS   Per HPI; all other systems  reviewed and are negative  Past History   The following was personally reviewed:  Past Medical History:  Diagnosis Date   Hypertension    History reviewed. No pertinent surgical history. History reviewed. No pertinent family history. Social History   Socioeconomic History   Marital status: Married    Spouse name: Not on file   Number of children: Not on file   Years of education: Not on file   Highest education level: Not on file  Occupational History   Not on file  Tobacco Use   Smoking status: Never   Smokeless tobacco: Never  Substance and Sexual Activity   Alcohol use: No   Drug use: No   Sexual activity: Not on file  Other Topics Concern   Not on file  Social History Narrative   Not on file   Social Determinants of Health   Financial Resource Strain: Not on file  Food Insecurity: Not on file  Transportation Needs: Not on file  Physical Activity: Not on file  Stress: Not on file  Social Connections: Not on file   No Known Allergies  Medications   (Not in a hospital admission)     Current Facility-Administered Medications:    aspirin chewable tablet 324 mg, 324 mg, Oral, Once, Pricilla Loveless, MD  Current Outpatient Medications:    amLODipine (NORVASC) 10  MG tablet, Take 1 tablet (10 mg total) by mouth daily., Disp: 30 tablet, Rfl: 2   losartan (COZAAR) 50 MG tablet, Take 1 tablet (50 mg total) by mouth daily., Disp: 30 tablet, Rfl: 1  Vitals   Vitals:   07/23/23 1430 07/23/23 1445 07/23/23 1454 07/23/23 1500  BP:  (!) 217/103 (!) 206/90 (!) 182/96  Pulse: 92 99 84 91  Resp: 19 (!) 21 (!) 23 (!) 22  Temp:    98.4 F (36.9 C)  TempSrc:    Oral  SpO2: 96% 96% 96% 97%  Weight:      Height:         Body mass index is 35.18 kg/m.  Physical Exam   Exam performed over telemedicine with 2-way video and audio communication and with assistance of bedside RN  Physical Exam Gen: A&O x4, NAD Resp: normal WOB CV: extremities appear  well-perfused  Neuro: *MS: A&O x4. Follows multi-step commands.  *Speech: mild dysarthric, no aphasia, able to name and repeat *CN: PERRL 3mm, EOMI, VFF by confrontation, sensation intact, R facial droop, hearing intact to voice *Motor:   Normal bulk.  No tremor, rigidity or bradykinesia. No pronator drift. All extremities appear full-strength and symmetric. *Sensory: SILT. Symmetric. No double-simultaneous extinction.  *Coordination:  Finger-to-nose, heel-to-shin, rapid alternating motions were intact. *Reflexes:  UTA 2/2 tele-exam *Gait: deferred  NIHSS = 2 for facial droop and slurred speech  Premorbid mRS = 0   Labs   CBC:  Recent Labs  Lab 07/23/23 1229  WBC 8.3  NEUTROABS 3.5  HGB 14.0  HCT 45.4  MCV 95.6  PLT 217    Basic Metabolic Panel:  Lab Results  Component Value Date   NA 137 07/23/2023   K 3.4 (L) 07/23/2023   CO2 27 07/23/2023   GLUCOSE 110 (H) 07/23/2023   BUN 14 07/23/2023   CREATININE 0.81 07/23/2023   CALCIUM 8.8 (L) 07/23/2023   GFRNONAA >60 07/23/2023   GFRAA >60 10/18/2017   Lipid Panel: No results found for: "LDLCALC" HgbA1c:  Lab Results  Component Value Date   HGBA1C 5.0 10/17/2017   Urine Drug Screen:     Component Value Date/Time   LABOPIA NONE DETECTED 07/23/2023 1259   COCAINSCRNUR NONE DETECTED 07/23/2023 1259   LABBENZ NONE DETECTED 07/23/2023 1259   AMPHETMU NONE DETECTED 07/23/2023 1259   THCU NONE DETECTED 07/23/2023 1259   LABBARB NONE DETECTED 07/23/2023 1259    Alcohol Level     Component Value Date/Time   ETH <10 07/23/2023 1252    CT Head without contrast: No acute process on personal review  Impression   This is a 67 year old woman with a past medical history significant for hypertension who presents with right-sided facial droop and slurred speech that started at 1030 today. TNK was not administered 2/2 sx too mild to treat. Patient is now outside the TNK window.   Recommendations   - Admit to [facility] for  stroke workup - Permissive HTN x48 hrs from sx onset or until stroke ruled out by MRI goal BP <220/120. PRN labetalol or hydralazine if BP above these parameters. Avoid oral antihypertensives. - MRI brain wo contrast - CTA/MRA H&N - TTE w/ bubble - Check A1c and LDL + add statin per guidelines - ASA 81mg  daily + plavix 75mg  daily x21 days f/b ASA 81mg  daily monotherapy after that - q4 hr neuro checks - STAT head CT for any change in neuro exam - Tele - PT/OT/SLP - Stroke education -  Amb referral to neurology upon discharge   ______________________________________________________________________   Thank you for the opportunity to take part in the care of this patient. If you have any further questions, please contact the neurology consultation attending.  Signed,  Bing Neighbors, MD [facility] [phone]  If 7pm- 7am, please page neurology on call as listed in AMION.  **Any copied and pasted documentation in this note was written by me in another application not billed for and pasted by me into this document.

## 2023-07-23 NOTE — ED Notes (Signed)
Attempted to call report. Nurse to call back. 

## 2023-07-23 NOTE — H&P (Addendum)
History and Physical    Patient: Lauren Grimes DOB: [birth year] DOA: 07/23/2023 DOS: the patient was seen and examined on 07/23/2023 PCP: Patient, No Pcp Per  Patient coming from: Home  Chief Complaint:  Chief Complaint  Patient presents with   Aphasia   HPI: Lauren Grimes is a 67 y.o. female with medical history significant of high blood pressure and noncompliance.  This afternoon she noticed that her speech was very slurred.  She also had weakness in her right arm and unsteadiness when she tried to get up and walk.  She denies any dizziness or headache or fever or chills.  No chest pain or shortness of breath either.  The symptoms did scare her and she came into the ER for evaluation.  She was initially seen as a code stroke neuro did not recommend tPA because her symptoms were mild and improving.  After a couple of hours in the emergency department the patient's symptoms worsened again and neuro was reconsulted.  They did not recommend tPA as her symptoms remained mild.  It is recommended however that she be admitted to have an MRI and full stroke workup. She did receive aspirin in the emergency department.  Plavix has been ordered also per their recommendations. She was hospitalized approximately 5 years ago for hypertensive crisis.  After discharge she took her blood pressure medication for short period then stopped it.  She was in her usual state of health until today.  She does not have trouble with headache or any symptoms when her blood pressure is high.    Review of Systems: As mentioned in the history of present illness. All other systems reviewed and are negative. Past Medical History:  Diagnosis Date   Hypertension    History reviewed. No pertinent surgical history. Social History:  reports that she has never smoked. She has never used smokeless tobacco. She reports that she does not drink alcohol and does not use drugs.  No Known Allergies  History reviewed. No  pertinent family history.  Prior to Admission medications   Medication Sig Start Date End Date Taking? Authorizing Provider  amLODipine (NORVASC) 10 MG tablet Take 1 tablet (10 mg total) by mouth daily. 10/20/17   Catarina Hartshorn, MD  losartan (COZAAR) 50 MG tablet Take 1 tablet (50 mg total) by mouth daily. 10/19/17   Catarina Hartshorn, MD    Physical Exam: Vitals:   07/23/23 1530 07/23/23 1545 07/23/23 1600 07/23/23 1643  BP: (!) 198/95 (!) 190/93 (!) 177/93   Pulse:   83   Resp: (!) 22 20 (!) 27   Temp:    98.1 F (36.7 C)  TempSrc:    Oral  SpO2:   96%   Weight:      Height:       Physical Exam:  General: No acute distress, well developed, well nourished HEENT: Normocephalic, atraumatic, PERRL Cardiovascular: Normal rate and rhythm. Distal pulses intact. Pulmonary: Normal pulmonary effort, normal breath sounds Gastrointestinal: Nondistended abdomen, soft, non-tender, normoactive bowel sounds, no organomegaly Musculoskeletal:Normal ROM, no lower ext edema Lymphadenopathy: No cervical LAD. Skin: Skin is warm and dry. Neuro: No focal deficits noted, AAOx3. PSYCH: Attentive and cooperative  Data Reviewed:  Results for orders placed or performed during the hospital encounter of 07/23/23 (from the past 24 hour(s))  CBG monitoring, ED     Status: None   Collection Time: 07/23/23 12:27 PM  Result Value Ref Range   Glucose-Capillary 90 70 - 99 mg/dL  Protime-INR  Status: None   Collection Time: 07/23/23 12:29 PM  Result Value Ref Range   Prothrombin Time 13.6 11.4 - 15.2 seconds   INR 1.0 0.8 - 1.2  APTT     Status: None   Collection Time: 07/23/23 12:29 PM  Result Value Ref Range   aPTT 25 24 - 36 seconds  CBC     Status: None   Collection Time: 07/23/23 12:29 PM  Result Value Ref Range   WBC 8.3 4.0 - 10.5 K/uL   RBC 4.75 3.87 - 5.11 MIL/uL   Hemoglobin 14.0 12.0 - 15.0 g/dL   HCT 29.5 62.1 - 30.8 %   MCV 95.6 80.0 - 100.0 fL   MCH 29.5 26.0 - 34.0 pg   MCHC 30.8 30.0 -  36.0 g/dL   RDW 65.7 84.6 - 96.2 %   Platelets 217 150 - 400 K/uL   nRBC 0.0 0.0 - 0.2 %  Comprehensive metabolic panel     Status: Abnormal   Collection Time: 07/23/23 12:29 PM  Result Value Ref Range   Sodium 137 135 - 145 mmol/L   Potassium 3.4 (L) 3.5 - 5.1 mmol/L   Chloride 103 98 - 111 mmol/L   CO2 27 22 - 32 mmol/L   Glucose, Bld 110 (H) 70 - 99 mg/dL   BUN 14 8 - 23 mg/dL   Creatinine, Ser 9.52 0.44 - 1.00 mg/dL   Calcium 8.8 (L) 8.9 - 10.3 mg/dL   Total Protein 7.7 6.5 - 8.1 g/dL   Albumin 4.1 3.5 - 5.0 g/dL   AST 17 15 - 41 U/L   ALT 17 0 - 44 U/L   Alkaline Phosphatase 73 38 - 126 U/L   Total Bilirubin 0.7 0.3 - 1.2 mg/dL   GFR, Estimated >84 >13 mL/min   Anion gap 7 5 - 15  Differential     Status: Abnormal   Collection Time: 07/23/23 12:29 PM  Result Value Ref Range   Neutrophils Relative % 41 %   Neutro Abs 3.5 1.7 - 7.7 K/uL   Lymphocytes Relative 49 %   Lymphs Abs 4.2 (H) 0.7 - 4.0 K/uL   Monocytes Relative 8 %   Monocytes Absolute 0.7 0.1 - 1.0 K/uL   Eosinophils Relative 1 %   Eosinophils Absolute 0.1 0.0 - 0.5 K/uL   Basophils Relative 0 %   Basophils Absolute 0.0 0.0 - 0.1 K/uL   Immature Granulocytes 1 %   Abs Immature Granulocytes 0.07 0.00 - 0.07 K/uL  Ethanol     Status: None   Collection Time: 07/23/23 12:52 PM  Result Value Ref Range   Alcohol, Ethyl (B) <10 <10 mg/dL  Urine rapid drug screen (hosp performed)     Status: None   Collection Time: 07/23/23 12:59 PM  Result Value Ref Range   Opiates NONE DETECTED NONE DETECTED   Cocaine NONE DETECTED NONE DETECTED   Benzodiazepines NONE DETECTED NONE DETECTED   Amphetamines NONE DETECTED NONE DETECTED   Tetrahydrocannabinol NONE DETECTED NONE DETECTED   Barbiturates NONE DETECTED NONE DETECTED  Urinalysis, Routine w reflex microscopic -Urine, Clean Catch     Status: Abnormal   Collection Time: 07/23/23 12:59 PM  Result Value Ref Range   Color, Urine YELLOW YELLOW   APPearance CLEAR CLEAR    Specific Gravity, Urine 1.010 1.005 - 1.030   pH 7.0 5.0 - 8.0   Glucose, UA NEGATIVE NEGATIVE mg/dL   Hgb urine dipstick NEGATIVE NEGATIVE   Bilirubin Urine NEGATIVE NEGATIVE  Ketones, ur NEGATIVE NEGATIVE mg/dL   Protein, ur 409 (A) NEGATIVE mg/dL   Nitrite NEGATIVE NEGATIVE   Leukocytes,Ua NEGATIVE NEGATIVE   RBC / HPF 0-5 0 - 5 RBC/hpf   WBC, UA 0-5 0 - 5 WBC/hpf   Bacteria, UA RARE (A) NONE SEEN   Squamous Epithelial / HPF 0-5 0 - 5 /HPF   Mucus PRESENT      Assessment and Plan: CVA vs hypertensive crisis - per neurology recommendations: - Brain MRI and MRA of head and neck ordered - Echo ordered - Aspirin 81 mg daily plus Plavix 75 mg x 21 days - PT/OT/Speech  2. Htn -the patient was prescribed losartan and amlodipine in the past per her old records.   -The patient did not continue to take her blood pressure medication because she believes she can control her blood pressure with diet and exercise.  I recommended that she check her blood pressure periodically and should her diet and exercise regimen fail to control her blood pressure she should continue taking the blood pressure medication likely for life. - Would resume losartan at discharge  3.  Recurrent hypokalemia - this is her second episode of hypokalemia with no clear etiology.  Since she also has hypertension she should probably be evaluated for primary aldosteronism once her potassium is repleted.  Aldosterone to renin ratio is ordered for 9 AM.  Consider CT of Adrenals.      Advance Care Planning:   Code Status: Full Code she names her husband as her surrogate decision-maker.  CODE STATUS was not discussed as I did not think she would be receptive to the topic.  She will be full code by default.  Consults: Neurology  Family Communication: Husband at bedside  Severity of Illness: The appropriate patient status for this patient is INPATIENT. Inpatient status is judged to be reasonable and necessary in order to  provide the required intensity of service to ensure the patient's safety. The patient's presenting symptoms, physical exam findings, and initial radiographic and laboratory data in the context of their chronic comorbidities is felt to place them at high risk for further clinical deterioration. Furthermore, it is not anticipated that the patient will be medically stable for discharge from the hospital within 2 midnights of admission.   * I certify that at the point of admission it is my clinical judgment that the patient will require inpatient hospital care spanning beyond 2 midnights from the point of admission due to high intensity of service, high risk for further deterioration and high frequency of surveillance required.*  Author: Buena Irish, MD 07/23/2023 5:09 PM  For on call review [email or web address].

## 2023-07-24 ENCOUNTER — Inpatient Hospital Stay (HOSPITAL_BASED_OUTPATIENT_CLINIC_OR_DEPARTMENT_OTHER): Payer: Medicare (Managed Care)

## 2023-07-24 DIAGNOSIS — I6389 Other cerebral infarction: Secondary | ICD-10-CM

## 2023-07-24 DIAGNOSIS — I639 Cerebral infarction, unspecified: Secondary | ICD-10-CM | POA: Diagnosis not present

## 2023-07-24 LAB — BASIC METABOLIC PANEL
Anion gap: 7 (ref 5–15)
BUN: 12 mg/dL (ref 8–23)
CO2: 26 mmol/L (ref 22–32)
Calcium: 8.8 mg/dL — ABNORMAL LOW (ref 8.9–10.3)
Chloride: 107 mmol/L (ref 98–111)
Creatinine, Ser: 0.71 mg/dL (ref 0.44–1.00)
GFR, Estimated: >60 mL/min (ref >60)
Glucose, Bld: 100 mg/dL — ABNORMAL HIGH (ref 70–99)
Potassium: 3.3 mmol/L — ABNORMAL LOW (ref 3.5–5.1)
Sodium: 140 mmol/L (ref 135–145)

## 2023-07-24 LAB — ECHOCARDIOGRAM COMPLETE
AR max vel: 2.96 cm2
AV Area VTI: 2.62 cm2
AV Area mean vel: 2.6 cm2
AV Mean grad: 6.6 mm[Hg]
AV Peak grad: 10.6 mm[Hg]
Ao pk vel: 1.63 m/s
Area-P 1/2: 3.21 cm2
Height: 67.5 in
S' Lateral: 2.7 cm
Weight: 3648 [oz_av]

## 2023-07-24 LAB — CBC
HCT: 41.3 % (ref 36.0–46.0)
Hemoglobin: 13 g/dL (ref 12.0–15.0)
MCH: 29.6 pg (ref 26.0–34.0)
MCHC: 31.5 g/dL (ref 30.0–36.0)
MCV: 94.1 fL (ref 80.0–100.0)
Platelets: 200 10*3/uL (ref 150–400)
RBC: 4.39 MIL/uL (ref 3.87–5.11)
RDW: 14 % (ref 11.5–15.5)
WBC: 8.3 10*3/uL (ref 4.0–10.5)
nRBC: 0 % (ref 0.0–0.2)

## 2023-07-24 LAB — LIPID PANEL
Cholesterol: 239 mg/dL — ABNORMAL HIGH (ref 0–200)
HDL: 47 mg/dL (ref >40)
LDL Cholesterol: 174 mg/dL — ABNORMAL HIGH (ref 0–99)
Total CHOL/HDL Ratio: 5.1 {ratio}
Triglycerides: 91 mg/dL (ref <150)
VLDL: 18 mg/dL (ref 0–40)

## 2023-07-24 LAB — HIV ANTIBODY (ROUTINE TESTING W REFLEX): HIV Screen 4th Generation wRfx: NONREACTIVE

## 2023-07-24 LAB — HEMOGLOBIN A1C
Hgb A1c MFr Bld: 5.1 % (ref 4.8–5.6)
Mean Plasma Glucose: 99.67 mg/dL

## 2023-07-24 LAB — MAGNESIUM: Magnesium: 2 mg/dL (ref 1.7–2.4)

## 2023-07-24 LAB — TSH: TSH: 2.585 u[IU]/mL (ref 0.350–4.500)

## 2023-07-24 MED ORDER — POTASSIUM CHLORIDE 20 MEQ PO PACK
40.0000 meq | PACK | Freq: Once | ORAL | Status: AC
  Administered 2023-07-24: 40 meq via ORAL
  Filled 2023-07-24: qty 2

## 2023-07-24 MED ORDER — LOSARTAN POTASSIUM 50 MG PO TABS: 50.0000 mg | ORAL_TABLET | Freq: Every day | ORAL | 1 refills | Status: AC

## 2023-07-24 MED ORDER — CLOPIDOGREL BISULFATE 75 MG PO TABS: 75.0000 mg | ORAL_TABLET | Freq: Every day | ORAL | Status: DC

## 2023-07-24 MED ORDER — ATORVASTATIN CALCIUM 40 MG PO TABS
40.0000 mg | ORAL_TABLET | Freq: Every day | ORAL | Status: DC
  Administered 2023-07-24: 40 mg via ORAL
  Filled 2023-07-24: qty 1

## 2023-07-24 MED ORDER — CLOPIDOGREL BISULFATE 75 MG PO TABS: 75.0000 mg | ORAL_TABLET | Freq: Every day | ORAL | 0 refills | Status: AC

## 2023-07-24 MED ORDER — ATORVASTATIN CALCIUM 40 MG PO TABS: 40.0000 mg | ORAL_TABLET | Freq: Every day | ORAL | 2 refills | Status: AC

## 2023-07-24 MED ORDER — AMLODIPINE BESYLATE 10 MG PO TABS: 10.0000 mg | ORAL_TABLET | Freq: Every day | ORAL | 2 refills | Status: AC

## 2023-07-24 MED ORDER — ASPIRIN 81 MG PO CHEW: 81.0000 mg | CHEWABLE_TABLET | Freq: Every day | ORAL | 2 refills | Status: AC

## 2023-07-24 NOTE — Care Management Obs Status (Signed)
MEDICARE OBSERVATION STATUS NOTIFICATION   Patient Details  Name: Lauren Grimes MRN: [id number] Date of Birth: 14-Mar-1956   Medicare Observation Status Notification Given:  Yes    Villa Herb, Theresia Majors 07/24/2023, 4:50 PM

## 2023-07-24 NOTE — Progress Notes (Signed)
Echocardiogram 2D Echocardiogram has been performed.  Lauren Grimes 07/24/2023, 2:41 PM

## 2023-07-24 NOTE — Discharge Summary (Signed)
Physician Discharge Summary  Lauren Grimes NWG:[id number] DOB: 01-22-1956 DOA: 07/23/2023  PCP: Patient, No Pcp Per  Admit date: 07/23/2023  Discharge date: 07/24/2023  Admitted From:Home  Disposition:  Home  Recommendations for Outpatient Follow-up:  Follow up with PCP in 1-2 weeks Follow-up with neurology in 3 months with ambulatory referral sent Continue on aspirin and Plavix as prescribed for 3 months and then aspirin only Atorvastatin 40 mg daily Recommend reinitiation of blood pressure medications  Home Health: None  Equipment/Devices: None  Discharge Condition:Stable  CODE STATUS: Full  Diet recommendation: Heart Healthy  Brief/Interim Summary: FELISIA UHLMANN is a 67 y.o. female with medical history significant of high blood pressure and noncompliance.  This afternoon she noticed that her speech was very slurred.  She also had weakness in her right arm and unsteadiness when she tried to get up and walk.  She denies any dizziness or headache or fever or chills.  No chest pain or shortness of breath either.  The symptoms did scare her and she came into the ER for evaluation.  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  A1c 5.1% and LDL 174.  PT with no recommendations noted.  She will need to follow-up with neurology in the next 3 months.  Discharge Diagnoses:  Principal Problem:   Stroke [facility])  Principal discharge diagnosis: Acute ischemic CVA  Discharge Instructions  Discharge Instructions     Ambulatory referral to Neurology   Complete by: As directed    An appointment is requested in approximately: 8-12 weeks   Diet - low sodium heart healthy   Complete by: As directed    Increase activity slowly   Complete by: As directed       Allergies as of 07/24/2023   No  Known Allergies      Medication List     TAKE these medications    amLODipine 10 MG tablet Commonly known as: NORVASC Take 1 tablet (10 mg total) by mouth daily.   aspirin 81 MG chewable tablet Chew 1 tablet (81 mg total) by mouth daily. Start taking on: July 25, 2023   atorvastatin 40 MG tablet Commonly known as: LIPITOR Take 1 tablet (40 mg total) by mouth daily. Start taking on: July 25, 2023   BIOTIN PO Take 1 tablet by mouth daily.   clopidogrel 75 MG tablet Commonly known as: PLAVIX Take 1 tablet (75 mg total) by mouth daily. Start taking on: July 25, 2023   COLLAGEN PO Take 1 Scoop by mouth daily.   losartan 50 MG tablet Commonly known as: COZAAR Take 1 tablet (50 mg total) by mouth daily.   QC TUMERIC COMPLEX PO Take 1 tablet by mouth daily.   VITAMIN B 12 PO Take 1 tablet by mouth daily.   VITAMIN D PO Take 1 tablet by mouth daily at 12 noon.        Follow-up Information     [facility]. Go to.   Contact information: [address]     [address] [place] [address] [phone]               No Known Allergies  Consultations: Neurology   Procedures/Studies: ECHOCARDIOGRAM COMPLETE  Result Date: 07/24/2023    ECHOCARDIOGRAM REPORT   Patient Name:   [facility]  Lorenz Coaster Date of Exam: 07/24/2023 Medical Rec #:  [id number]    Height:       67.5 in Accession #:    [id number]   Weight:       228.0 lb Date of Birth:  1956-09-15    BSA:          2.150 m Patient Age:    67 years     BP:           177/75 mmHg Patient Gender: F            HR:           77 bpm. [facility] Procedure: 2D Echo, Cardiac Doppler and Color Doppler Indications:    Stroke I63.9  History:        Patient has prior history of Echocardiogram examinations, most                 recent 10/18/2017. Stroke, Arrythmias:Atrial Fibrillation; Risk                 Factors:Hypertension.  Sonographer:    Lucendia Herrlich RCS  Referring Phys: [phone] CLAUDIA CLAIBORNE IMPRESSIONS  1. Left ventricular ejection fraction, by estimation, is 65 to 70%. The left ventricle has normal function. The left ventricle has no regional wall motion abnormalities. There is mild left ventricular hypertrophy. Left ventricular diastolic parameters are consistent with Grade I diastolic dysfunction (impaired relaxation).  2. Right ventricular systolic function is normal. The right ventricular size is normal.  3. The mitral valve is normal in structure. No evidence of mitral valve regurgitation. No evidence of mitral stenosis.  4. The aortic valve has an indeterminant number of cusps. There is mild calcification of the aortic valve. There is mild thickening of the aortic valve. Aortic valve regurgitation is not visualized. No aortic stenosis is present.  5. The inferior vena cava is normal in size with greater than 50% respiratory variability, suggesting right atrial pressure of 3 mmHg. FINDINGS  Left Ventricle: Left ventricular ejection fraction, by estimation, is 65 to 70%. The left ventricle has normal function. The left ventricle has no regional wall motion abnormalities. The left ventricular internal cavity size was normal in size. There is  mild left ventricular hypertrophy. Left ventricular diastolic parameters are consistent with Grade I diastolic dysfunction (impaired relaxation). Normal left ventricular filling pressure. Right Ventricle: The right ventricular size is normal. Right vetricular wall thickness was not well visualized. Right ventricular systolic function is normal. Left Atrium: Left atrial size was normal in size. Right Atrium: Right atrial size was normal in size. Pericardium: There is no evidence of pericardial effusion. Mitral Valve: The mitral valve is normal in structure. No evidence of mitral valve regurgitation. No evidence of mitral valve stenosis. Tricuspid Valve: The tricuspid valve is normal in structure. Tricuspid valve  regurgitation is not demonstrated. No evidence of tricuspid stenosis. Aortic Valve: The aortic valve has an indeterminant number of cusps. There is mild calcification of the aortic valve. There is mild thickening of the aortic valve. There is mild aortic valve annular calcification. Aortic valve regurgitation is not visualized. No aortic stenosis is present. Aortic valve mean gradient measures 6.6 mmHg. Aortic valve peak gradient measures 10.6 mmHg. Aortic valve area, by VTI measures 2.62 cm. Pulmonic Valve: The pulmonic valve was not well visualized. Pulmonic valve regurgitation is not visualized. No evidence of pulmonic stenosis. Aorta: The aortic root and ascending aorta are structurally normal, with no evidence of dilitation. Venous: The  inferior vena cava is normal in size with greater than 50% respiratory variability, suggesting right atrial pressure of 3 mmHg. IAS/Shunts: No atrial level shunt detected by color flow Doppler.  LEFT VENTRICLE PLAX 2D LVIDd:         4.20 cm   Diastology LVIDs:         2.70 cm   LV e' medial:    5.55 cm/s LV PW:         1.10 cm   LV E/e' medial:  10.7 LV IVS:        1.20 cm   LV e' lateral:   5.11 cm/s LVOT diam:     2.20 cm   LV E/e' lateral: 11.7 LV SV:         88 LV SV Index:   41 LVOT Area:     3.80 cm  RIGHT VENTRICLE             IVC RV S prime:     19.90 cm/s  IVC diam: 1.40 cm TAPSE (M-mode): 2.0 cm LEFT ATRIUM             Index        RIGHT ATRIUM           Index LA diam:        3.10 cm 1.44 cm/m   RA Area:     13.90 cm LA Vol (A2C):   58.0 ml 26.95 ml/m  RA Volume:   32.90 ml  15.30 ml/m LA Vol (A4C):   62.9 ml 29.26 ml/m LA Biplane Vol: 69.6 ml 32.37 ml/m  AORTIC VALVE AV Area (Vmax):    2.96 cm AV Area (Vmean):   2.60 cm AV Area (VTI):     2.62 cm AV Vmax:           162.86 cm/s AV Vmean:          119.291 cm/s AV VTI:            0.336 m AV Peak Grad:      10.6 mmHg AV Mean Grad:      6.6 mmHg LVOT Vmax:         127.00 cm/s LVOT Vmean:        81.675 cm/s LVOT  VTI:          0.232 m LVOT/AV VTI ratio: 0.69  AORTA Ao Root diam: 3.50 cm Ao Asc diam:  3.30 cm MITRAL VALVE               TRICUSPID VALVE MV Area (PHT): 3.21 cm    TR Peak grad:   8.2 mmHg MV Decel Time: 236 msec    TR Vmax:        143.00 cm/s MV E velocity: 59.60 cm/s MV A velocity: 93.40 cm/s  SHUNTS MV E/A ratio:  0.64        Systemic VTI:  0.23 m                            Systemic Diam: 2.20 cm Dina Rich MD Electronically signed by Dina Rich MD Signature Date/Time: 07/24/2023/3:50:43 PM    Final    MR BRAIN WO CONTRAST  Result Date: 07/23/2023 CLINICAL DATA:  Slurred speech and facial droop, right EXAM: MRI HEAD WITHOUT CONTRAST MRA HEAD WITHOUT CONTRAST MRA NECK WITHOUT AND WITH CONTRAST TECHNIQUE: Multiplanar, multi-echo pulse sequences of the brain and surrounding structures were acquired without intravenous contrast. Angiographic images  of the Circle of Willis were acquired using MRA technique without intravenous contrast. Angiographic images of the neck were acquired using MRA technique without and with intravenous contrast. Carotid stenosis measurements (when applicable) are obtained utilizing NASCET criteria, using the distal internal carotid diameter as the denominator. CONTRAST:  10mL GADAVIST GADOBUTROL 1 MMOL/ML IV SOLN COMPARISON:  10/18/2017 brain MRI FINDINGS: MRI HEAD FINDINGS Brain: There is a small focus of acute/early subacute ischemia of the left caudate, close to the posterior limb of the left internal capsule. Chronic microhemorrhage at the left insula. There is multifocal hyperintense T2-weighted signal within the white matter. Parenchymal volume and CSF spaces are normal. The midline structures are normal. Vascular: Normal flow voids. Skull and upper cervical spine: Normal calvarium and skull base. Visualized upper cervical spine and soft tissues are normal. Sinuses/Orbits:No paranasal sinus fluid levels or advanced mucosal thickening. No mastoid or middle ear  effusion. Normal orbits. MRA HEAD FINDINGS POSTERIOR CIRCULATION: --Vertebral arteries: Diminished flow related enhancement of the left V4 segment. Normal right. --Inferior cerebellar arteries: Normal. --Basilar artery: Multifocal atherosclerotic irregularity with mild stenosis of the proximal basilar artery. --Superior cerebellar arteries: Normal. --Posterior cerebral arteries: Normal. ANTERIOR CIRCULATION: --Intracranial internal carotid arteries: Normal. --Anterior cerebral arteries (ACA): Azygous configuration with severe stenosis of the distal shared A2 branch. --Middle cerebral arteries (MCA): Severe stenosis of the distal left M1 segment. Normal right MCA. Anatomic variants: Fetal predominant origins of the posterior cerebral arteries. MRA NECK FINDINGS Aortic arch: Normal 3 vessel pattern.  Normal subclavian arteries. Right carotid system: Normal Left carotid system: Normal Vertebral arteries: There is loss of contrast enhancement of the left vertebral artery at the V3 segment. No enhancement of the V4 segment. Other: None IMPRESSION: 1. Small focus of acute/early subacute ischemia of the left caudate, close to the posterior limb of the left internal capsule. 2. 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 3. Severe stenosis of the distal left MCA M1 segment. 4. Loss of contrast enhancement of the left vertebral artery at the V3 segment, consistent with occlusion. Electronically Signed   By: Deatra Robinson M.D.   On: 07/23/2023 22:20   MR ANGIO HEAD WO CONTRAST  Result Date: 07/23/2023 CLINICAL DATA:  Slurred speech and facial droop, right EXAM: MRI HEAD WITHOUT CONTRAST MRA HEAD WITHOUT CONTRAST MRA NECK WITHOUT AND WITH CONTRAST TECHNIQUE: Multiplanar, multi-echo pulse sequences of the brain and surrounding structures were acquired without intravenous contrast. Angiographic images of the Circle of Willis were acquired using MRA technique without intravenous contrast.  Angiographic images of the neck were acquired using MRA technique without and with intravenous contrast. Carotid stenosis measurements (when applicable) are obtained utilizing NASCET criteria, using the distal internal carotid diameter as the denominator. CONTRAST:  10mL GADAVIST GADOBUTROL 1 MMOL/ML IV SOLN COMPARISON:  10/18/2017 brain MRI FINDINGS: MRI HEAD FINDINGS Brain: There is a small focus of acute/early subacute ischemia of the left caudate, close to the posterior limb of the left internal capsule. Chronic microhemorrhage at the left insula. There is multifocal hyperintense T2-weighted signal within the white matter. Parenchymal volume and CSF spaces are normal. The midline structures are normal. Vascular: Normal flow voids. Skull and upper cervical spine: Normal calvarium and skull base. Visualized upper cervical spine and soft tissues are normal. Sinuses/Orbits:No paranasal sinus fluid levels or advanced mucosal thickening. No mastoid or middle ear effusion. Normal orbits. MRA HEAD FINDINGS POSTERIOR CIRCULATION: --Vertebral arteries: Diminished flow related enhancement of the left V4 segment. Normal right. --  Inferior cerebellar arteries: Normal. --Basilar artery: Multifocal atherosclerotic irregularity with mild stenosis of the proximal basilar artery. --Superior cerebellar arteries: Normal. --Posterior cerebral arteries: Normal. ANTERIOR CIRCULATION: --Intracranial internal carotid arteries: Normal. --Anterior cerebral arteries (ACA): Azygous configuration with severe stenosis of the distal shared A2 branch. --Middle cerebral arteries (MCA): Severe stenosis of the distal left M1 segment. Normal right MCA. Anatomic variants: Fetal predominant origins of the posterior cerebral arteries. MRA NECK FINDINGS Aortic arch: Normal 3 vessel pattern.  Normal subclavian arteries. Right carotid system: Normal Left carotid system: Normal Vertebral arteries: There is loss of contrast enhancement of the left vertebral  artery at the V3 segment. No enhancement of the V4 segment. Other: None IMPRESSION: 1. Small focus of acute/early subacute ischemia of the left caudate, close to the posterior limb of the left internal capsule. 2. 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 3. Severe stenosis of the distal left MCA M1 segment. 4. Loss of contrast enhancement of the left vertebral artery at the V3 segment, consistent with occlusion. Electronically Signed   By: Deatra Robinson M.D.   On: 07/23/2023 22:20   MR ANGIO NECK W WO CONTRAST  Result Date: 07/23/2023 CLINICAL DATA:  Slurred speech and facial droop, right EXAM: MRI HEAD WITHOUT CONTRAST MRA HEAD WITHOUT CONTRAST MRA NECK WITHOUT AND WITH CONTRAST TECHNIQUE: Multiplanar, multi-echo pulse sequences of the brain and surrounding structures were acquired without intravenous contrast. Angiographic images of the Circle of Willis were acquired using MRA technique without intravenous contrast. Angiographic images of the neck were acquired using MRA technique without and with intravenous contrast. Carotid stenosis measurements (when applicable) are obtained utilizing NASCET criteria, using the distal internal carotid diameter as the denominator. CONTRAST:  10mL GADAVIST GADOBUTROL 1 MMOL/ML IV SOLN COMPARISON:  10/18/2017 brain MRI FINDINGS: MRI HEAD FINDINGS Brain: There is a small focus of acute/early subacute ischemia of the left caudate, close to the posterior limb of the left internal capsule. Chronic microhemorrhage at the left insula. There is multifocal hyperintense T2-weighted signal within the white matter. Parenchymal volume and CSF spaces are normal. The midline structures are normal. Vascular: Normal flow voids. Skull and upper cervical spine: Normal calvarium and skull base. Visualized upper cervical spine and soft tissues are normal. Sinuses/Orbits:No paranasal sinus fluid levels or advanced mucosal thickening. No mastoid or middle  ear effusion. Normal orbits. MRA HEAD FINDINGS POSTERIOR CIRCULATION: --Vertebral arteries: Diminished flow related enhancement of the left V4 segment. Normal right. --Inferior cerebellar arteries: Normal. --Basilar artery: Multifocal atherosclerotic irregularity with mild stenosis of the proximal basilar artery. --Superior cerebellar arteries: Normal. --Posterior cerebral arteries: Normal. ANTERIOR CIRCULATION: --Intracranial internal carotid arteries: Normal. --Anterior cerebral arteries (ACA): Azygous configuration with severe stenosis of the distal shared A2 branch. --Middle cerebral arteries (MCA): Severe stenosis of the distal left M1 segment. Normal right MCA. Anatomic variants: Fetal predominant origins of the posterior cerebral arteries. MRA NECK FINDINGS Aortic arch: Normal 3 vessel pattern.  Normal subclavian arteries. Right carotid system: Normal Left carotid system: Normal Vertebral arteries: There is loss of contrast enhancement of the left vertebral artery at the V3 segment. No enhancement of the V4 segment. Other: None IMPRESSION: 1. Small focus of acute/early subacute ischemia of the left caudate, close to the posterior limb of the left internal capsule. 2. 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 3. Severe stenosis of the distal left MCA M1 segment. 4. Loss of contrast enhancement of the left vertebral artery  at the V3 segment, consistent with occlusion. Electronically Signed   By: Deatra Robinson M.D.   On: 07/23/2023 22:20   CT HEAD CODE STROKE WO CONTRAST  Result Date: 07/23/2023 CLINICAL DATA:  Code stroke.  Right-sided weakness EXAM: CT HEAD WITHOUT CONTRAST TECHNIQUE: Contiguous axial images were obtained from the base of the skull through the vertex without intravenous contrast. RADIATION DOSE REDUCTION: This exam was performed according to the departmental dose-optimization program which includes automated exposure control, adjustment of the mA  and/or kV according to patient size and/or use of iterative reconstruction technique. COMPARISON:  Brain MRI 10/18/2017 FINDINGS: Brain: There is no acute intracranial hemorrhage, extra-axial fluid collection, or acute territorial infarct. Parenchymal volume is normal. The ventricles are normal in size. Gray-white differentiation is preserved The pituitary and suprasellar region are normal. There is no mass lesion. There is no mass effect or midline shift. Vascular: There is calcification of the bilateral carotid siphons. No dense vessel is seen. Skull: Normal. Negative for fracture or focal lesion. Sinuses/Orbits: The imaged paranasal sinuses are clear. The globes and orbits are unremarkable. Other: The mastoid air cells and middle ear cavities are clear. ASPECTS [facility] Stroke Program Early CT Score) - Ganglionic level infarction (caudate, lentiform nuclei, internal capsule, insula, M1-M3 cortex): 7 - Supraganglionic infarction (M4-M6 cortex): 3 Total score (0-10 with 10 being normal): 10 IMPRESSION: 1. No acute intracranial pathology. 2. ASPECTS is 10 These results were called by telephone at the time of interpretation on 07/23/2023 at 12:44 pm to provider Pricilla Loveless , who verbally acknowledged these results. Electronically Signed   By: Lesia Hausen M.D.   On: 07/23/2023 12:48     Discharge Exam: Vitals:   07/24/23 1000 07/24/23 1343  BP: (!) 177/75 (!) 182/94  Pulse: 78 76  Resp:    Temp:  97.7 F (36.5 C)  SpO2:  97%   Vitals:   07/24/23 0344 07/24/23 0845 07/24/23 1000 07/24/23 1343  BP: (!) 181/94 (!) 195/106 (!) 177/75 (!) 182/94  Pulse: 71 81 78 76  Resp: 18     Temp: 98.7 F (37.1 C) 98.2 F (36.8 C)  97.7 F (36.5 C)  TempSrc:  Oral  Oral  SpO2: 100% 100%  97%  Weight:      Height:        General: Pt is alert, awake, not in acute distress Cardiovascular: RRR, S1/S2 +, no rubs, no gallops Respiratory: CTA bilaterally, no wheezing, no rhonchi Abdominal: Soft, NT, ND,  bowel sounds + Extremities: no edema, no cyanosis    The results of significant diagnostics from this hospitalization (including imaging, microbiology, ancillary and laboratory) are listed below for reference.     Microbiology: No results found for this or any previous visit (from the past 240 hour(s)).   Labs: BNP (last 3 results) No results for input(s): "BNP" in the last 8760 hours. Basic Metabolic Panel: Recent Labs  Lab 07/23/23 1229 07/24/23 0439  NA 137 140  K 3.4* 3.3*  CL 103 107  CO2 27 26  GLUCOSE 110* 100*  BUN 14 12  CREATININE 0.81 0.71  CALCIUM 8.8* 8.8*  MG 1.8 2.0   Liver Function Tests: Recent Labs  Lab 07/23/23 1229  AST 17  ALT 17  ALKPHOS 73  BILITOT 0.7  PROT 7.7  ALBUMIN 4.1   No results for input(s): "LIPASE", "AMYLASE" in the last 168 hours. No results for input(s): "AMMONIA" in the last 168 hours. CBC: Recent Labs  Lab 07/23/23 1229 07/24/23  0439  WBC 8.3 8.3  NEUTROABS 3.5  --   HGB 14.0 13.0  HCT 45.4 41.3  MCV 95.6 94.1  PLT 217 200   Cardiac Enzymes: No results for input(s): "CKTOTAL", "CKMB", "CKMBINDEX", "TROPONINI" in the last 168 hours. BNP: Invalid input(s): "POCBNP" CBG: Recent Labs  Lab 07/23/23 1227  GLUCAP 90   D-Dimer No results for input(s): "DDIMER" in the last 72 hours. Hgb A1c Recent Labs    07/23/23 1229  HGBA1C 5.1   Lipid Profile Recent Labs    07/24/23 0439  CHOL 239*  HDL 47  LDLCALC 174*  TRIG 91  CHOLHDL 5.1   Thyroid function studies Recent Labs    07/24/23 0439  TSH 2.585   Anemia work up No results for input(s): "VITAMINB12", "FOLATE", "FERRITIN", "TIBC", "IRON", "RETICCTPCT" in the last 72 hours. Urinalysis    Component Value Date/Time   COLORURINE YELLOW 07/23/2023 1259   APPEARANCEUR CLEAR 07/23/2023 1259   LABSPEC 1.010 07/23/2023 1259   PHURINE 7.0 07/23/2023 1259   GLUCOSEU NEGATIVE 07/23/2023 1259   HGBUR NEGATIVE 07/23/2023 1259   BILIRUBINUR NEGATIVE  07/23/2023 1259   KETONESUR NEGATIVE 07/23/2023 1259   PROTEINUR 100 (A) 07/23/2023 1259   NITRITE NEGATIVE 07/23/2023 1259   LEUKOCYTESUR NEGATIVE 07/23/2023 1259   Sepsis Labs Recent Labs  Lab 07/23/23 1229 07/24/23 0439  WBC 8.3 8.3   Microbiology No results found for this or any previous visit (from the past 240 hour(s)).   Time coordinating discharge: 35 minutes  SIGNED:   Erick Blinks, DO [facility] 07/24/2023, 4:04 PM  If 7PM-7AM, please contact night-coverage [email or web address]

## 2023-07-24 NOTE — TOC CM/SW Note (Signed)
Transition of Care [facility]) - Inpatient Brief Assessment   Patient Details  Name: Lauren Grimes MRN: [id number] Date of Birth: [birth year]  Transition of Care [facility]) CM/SW Contact:    Villa Herb, LCSWA Phone Number: 07/24/2023, 1:06 PM   Clinical Narrative: Transition of Care Department [facility]) has reviewed patient and no TOC needs have been identified at this time. We will continue to monitor patient advancement through interdisciplinary progression rounds. If new patient transition needs arise, please place a TOC consult.  Transition of Care Asessment: Insurance and Status: Insurance coverage has been reviewed Patient has primary care physician: Yes Home environment has been reviewed: from home Prior level of function:: independent Prior/Current Home Services: No current home services Social Determinants of Health Reivew: SDOH reviewed no interventions necessary Readmission risk has been reviewed: Yes Transition of care needs: no transition of care needs at this time

## 2023-07-24 NOTE — Evaluation (Signed)
Occupational Therapy Evaluation Patient Details Name: CORALYNE BERTH MRN: [id number] DOB: [birth year] Today's Date: 07/24/2023   History of Present Illness RHYANN SENAT is a 67 y.o. female with medical history significant of high blood pressure and noncompliance.  This afternoon she noticed that her speech was very slurred.  She also had weakness in her right arm and unsteadiness when she tried to get up and walk.  She denies any dizziness or headache or fever or chills.  No chest pain or shortness of breath either.  The symptoms did scare her and she came into the ER for evaluation.  She was initially seen as a code stroke neuro did not recommend tPA because her symptoms were mild and improving.  After a couple of hours in the emergency department the patient's symptoms worsened again and neuro was reconsulted.  They did not recommend tPA as her symptoms remained mild.  It is recommended however that she be admitted to have an MRI and full stroke workup. (per MD)   Clinical Impression   Pt agreeable to OT and PT co-evaluation. Pt reports feeling near baseline function. Pt able to ambulate in the room and hall independently. Independent donning of shoes. WFL B UE strength and function. Pt is not recommended for further acute OT services and will be discharged to care of nursing staff for remaining length of stay.              Functional Status Assessment  Patient has not had a recent decline in their functional status  Equipment Recommendations  None recommended by OT           Precautions / Restrictions Precautions Precautions: Fall Restrictions Weight Bearing Restrictions: No      Mobility Bed Mobility Overal bed mobility: Independent                  Transfers Overall transfer level: Independent                        Balance Overall balance assessment: Mild deficits observed, not formally tested                                          ADL either performed or assessed with clinical judgement   ADL Overall ADL's : Independent                                             Vision Baseline Vision/History: 1 Wears glasses Ability to See in Adequate Light: 1 Impaired Patient Visual Report: No change from baseline Vision Assessment?: Wears glasses for reading;No apparent visual deficits     Perception Perception: Not tested       Praxis Praxis: WFL       Pertinent Vitals/Pain Pain Assessment Pain Assessment: No/denies pain     Extremity/Trunk Assessment Upper Extremity Assessment Upper Extremity Assessment: Overall WFL for tasks assessed   Lower Extremity Assessment Lower Extremity Assessment: Defer to PT evaluation   Cervical / Trunk Assessment Cervical / Trunk Assessment: Normal   Communication Communication Communication: No apparent difficulties   Cognition Arousal: Alert Behavior During Therapy: WFL for tasks assessed/performed Overall Cognitive Status: Within Functional Limits for tasks assessed  Home Living Family/patient expects to be discharged to:: Private residence Living Arrangements: Spouse/significant other Available Help at Discharge: Family;Available 24 hours/day Type of Home: House       Home Layout: Laundry or work area in basement;Able to live on main level with bedroom/bathroom;Two level     Bathroom Shower/Tub: Chief Strategy Officer: Standard Bathroom Accessibility: Yes How Accessible: Accessible via walker Home Equipment: Grab bars - tub/shower          Prior Functioning/Environment Prior Level of Function : Independent/Modified Independent                                                Co-evaluation PT/OT/SLP Co-Evaluation/Treatment: Yes Reason for Co-Treatment: To address functional/ADL transfers   OT goals addressed during session:  ADL's and self-care                       End of Session    Activity Tolerance: Patient tolerated treatment well Patient left: in bed;with call bell/phone within reach;with family/visitor present  OT Visit Diagnosis: Other symptoms and signs involving the nervous system (Z61.096)                Time: 0454-0981 OT Time Calculation (min): 11 min Charges:  OT General Charges $OT Visit: 1 Visit OT Evaluation $OT Eval Low Complexity: 1 Low  Orville Widmann OT, MOT  Danie Chandler 07/24/2023, 9:21 AM

## 2023-07-24 NOTE — Plan of Care (Signed)
Problem: Education: Goal: Knowledge of disease or condition will improve 07/24/2023 0344 by Luetta Nutting, RN Outcome: Progressing 07/24/2023 0343 by Luetta Nutting, RN Outcome: Progressing Goal: Knowledge of secondary prevention will improve (MUST DOCUMENT ALL) 07/24/2023 0344 by Luetta Nutting, RN Outcome: Progressing 07/24/2023 0343 by Luetta Nutting, RN Outcome: Progressing Goal: Knowledge of patient specific risk factors will improve Loraine Leriche N/A or DELETE if not current risk factor) 07/24/2023 0344 by Luetta Nutting, RN Outcome: Progressing 07/24/2023 0343 by Luetta Nutting, RN Outcome: Progressing   Problem: Ischemic Stroke/TIA Tissue Perfusion: Goal: Complications of ischemic stroke/TIA will be minimized 07/24/2023 0344 by Luetta Nutting, RN Outcome: Progressing 07/24/2023 0343 by Luetta Nutting, RN Outcome: Progressing   Problem: Coping: Goal: Will verbalize positive feelings about self 07/24/2023 0344 by Luetta Nutting, RN Outcome: Progressing 07/24/2023 0343 by Luetta Nutting, RN Outcome: Progressing Goal: Will identify appropriate support needs 07/24/2023 0344 by Luetta Nutting, RN Outcome: Progressing 07/24/2023 0343 by Luetta Nutting, RN Outcome: Progressing   Problem: Health Behavior/Discharge Planning: Goal: Ability to manage health-related needs will improve 07/24/2023 0344 by Luetta Nutting, RN Outcome: Progressing 07/24/2023 0343 by Luetta Nutting, RN Outcome: Progressing Goal: Goals will be collaboratively established with patient/family 07/24/2023 0344 by Luetta Nutting, RN Outcome: Progressing 07/24/2023 0343 by Luetta Nutting, RN Outcome: Progressing   Problem: Self-Care: Goal: Ability to participate in self-care as condition permits will improve 07/24/2023 0344 by Luetta Nutting, RN Outcome: Progressing 07/24/2023 0343 by Luetta Nutting, RN Outcome: Progressing Goal: Verbalization of  feelings and concerns over difficulty with self-care will improve 07/24/2023 0344 by Luetta Nutting, RN Outcome: Progressing 07/24/2023 0343 by Luetta Nutting, RN Outcome: Progressing Goal: Ability to communicate needs accurately will improve 07/24/2023 0344 by Luetta Nutting, RN Outcome: Progressing 07/24/2023 0343 by Luetta Nutting, RN Outcome: Progressing   Problem: Nutrition: Goal: Risk of aspiration will decrease 07/24/2023 0344 by Luetta Nutting, RN Outcome: Progressing 07/24/2023 0343 by Luetta Nutting, RN Outcome: Progressing Goal: Dietary intake will improve 07/24/2023 0344 by Luetta Nutting, RN Outcome: Progressing 07/24/2023 0343 by Luetta Nutting, RN Outcome: Progressing   Problem: Education: Goal: Knowledge of General Education information will improve Description: Including pain rating scale, medication(s)/side effects and non-pharmacologic comfort measures 07/24/2023 0344 by Luetta Nutting, RN Outcome: Progressing 07/24/2023 0343 by Luetta Nutting, RN Outcome: Progressing   Problem: Health Behavior/Discharge Planning: Goal: Ability to manage health-related needs will improve 07/24/2023 0344 by Luetta Nutting, RN Outcome: Progressing 07/24/2023 0343 by Luetta Nutting, RN Outcome: Progressing   Problem: Clinical Measurements: Goal: Ability to maintain clinical measurements within normal limits will improve 07/24/2023 0344 by Luetta Nutting, RN Outcome: Progressing 07/24/2023 0343 by Luetta Nutting, RN Outcome: Progressing Goal: Will remain free from infection 07/24/2023 0344 by Luetta Nutting, RN Outcome: Progressing 07/24/2023 0343 by Luetta Nutting, RN Outcome: Progressing Goal: Diagnostic test results will improve 07/24/2023 0344 by Luetta Nutting, RN Outcome: Progressing 07/24/2023 0343 by Luetta Nutting, RN Outcome: Progressing Goal: Respiratory complications will improve 07/24/2023 0344 by  Luetta Nutting, RN Outcome: Progressing 07/24/2023 0343 by Luetta Nutting, RN Outcome: Progressing Goal: Cardiovascular complication will be avoided 07/24/2023 0344 by Luetta Nutting, RN Outcome: Progressing 07/24/2023 0343 by Luetta Nutting, RN Outcome: Progressing   Problem: Activity: Goal: Risk for activity intolerance will decrease 07/24/2023 0344 by Luetta Nutting, RN Outcome: Progressing  07/24/2023 0343 by Luetta Nutting, RN Outcome: Progressing   Problem: Nutrition: Goal: Adequate nutrition will be maintained 07/24/2023 0344 by Luetta Nutting, RN Outcome: Progressing 07/24/2023 0343 by Luetta Nutting, RN Outcome: Progressing   Problem: Coping: Goal: Level of anxiety will decrease 07/24/2023 0344 by Luetta Nutting, RN Outcome: Progressing 07/24/2023 0343 by Luetta Nutting, RN Outcome: Progressing   Problem: Elimination: Goal: Will not experience complications related to bowel motility 07/24/2023 0344 by Luetta Nutting, RN Outcome: Progressing 07/24/2023 0343 by Luetta Nutting, RN Outcome: Progressing Goal: Will not experience complications related to urinary retention 07/24/2023 0344 by Luetta Nutting, RN Outcome: Progressing 07/24/2023 0343 by Luetta Nutting, RN Outcome: Progressing   Problem: Pain Managment: Goal: General experience of comfort will improve 07/24/2023 0344 by Luetta Nutting, RN Outcome: Progressing 07/24/2023 0343 by Luetta Nutting, RN Outcome: Progressing   Problem: Safety: Goal: Ability to remain free from injury will improve 07/24/2023 0344 by Luetta Nutting, RN Outcome: Progressing 07/24/2023 0343 by Luetta Nutting, RN Outcome: Progressing   Problem: Skin Integrity: Goal: Risk for impaired skin integrity will decrease 07/24/2023 0344 by Luetta Nutting, RN Outcome: Progressing 07/24/2023 0343 by Luetta Nutting, RN Outcome: Progressing

## 2023-07-24 NOTE — Progress Notes (Signed)
I connected with  Lauren Grimes on 07/24/23 by a video enabled telemedicine application and verified that I am speaking with the correct person using two identifiers.   I discussed the limitations of evaluation and management by telemedicine. The patient expressed understanding and agreed to proceed.   Location of patient: [facility] Occasional physician: [facility]   Subjective: No acute events overnight.  Denies any other concerns.  ROS: negative except above  Examination  Vital signs in last 24 hours: Temp:  [97.6 F (36.4 C)-99.1 F (37.3 C)] 98.2 F (36.8 C) (10/22 0845) Pulse Rate:  [71-99] 81 (10/22 0845) Resp:  [13-27] 18 (10/22 0344) BP: (167-224)/(78-165) 195/106 (10/22 0845) SpO2:  [93 %-100 %] 100 % (10/22 0845) Weight:  [103.4 kg] 103.4 kg (10/21 1228)  General: lying in bed, NAD Neuro: AOx3, no aphasia, right facial droop, rest of the cranial nerves grossly grossly intact, antigravity strength without drift in all 4 extremities, sensory intact to light touch, FTN intact  Basic Metabolic Panel: Recent Labs  Lab 07/23/23 1229 07/24/23 0439  NA 137 140  K 3.4* 3.3*  CL 103 107  CO2 27 26  GLUCOSE 110* 100*  BUN 14 12  CREATININE 0.81 0.71  CALCIUM 8.8* 8.8*  MG 1.8 2.0    CBC: Recent Labs  Lab 07/23/23 1229 07/24/23 0439  WBC 8.3 8.3  NEUTROABS 3.5  --   HGB 14.0 13.0  HCT 45.4 41.3  MCV 95.6 94.1  PLT 217 200     Coagulation Studies: Recent Labs    07/23/23 1229  LABPROT 13.6  INR 1.0    Imaging personally reviewed  CT head without contrast 07/23/2023: No acute abnormality.  ASPECTS 10   MRI brain without contrast 07/23/2023:Small focus of acute/early subacute ischemia of the left caudate,close to the posterior limb of the left internal capsule.  MRA head without contrast and MRA neck with and without contrast 07/23/2023: Severe stenosis of the distal azygos A2 branch of the anterior cerebral artery. Severe  stenosis of the distal left MCA M1 segment. Loss of contrast enhancement of the left vertebral artery at the V3 segment, consistent with occlusion.     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  -Permissive HTN x48 hrs from sx onset with goal BP <220/120 followed by gradual normotension. PRN labetalol or hydralazine if BP above these parameters. Avoid oral antihypertensives. -PT/OT -Stroke education including BEFAST -Follow-up with neurology (order placed) -Discussed plan with patient, Dr. Sherryll Burger via secure chat  I have spent a total of   36 minutes with the patient reviewing hospital notes,  test results, labs and examining the patient as well as establishing an assessment and plan that was discussed personally with the patient.  > 50% of time was spent in direct patient care.         Lindie Spruce Epilepsy [facility] For questions after 5pm please refer to AMION to reach the Neurologist on call

## 2023-07-24 NOTE — Evaluation (Signed)
Physical Therapy Evaluation Patient Details Name: Lauren Grimes MRN: [id number] DOB: Apr 14, 1956 Today's Date: 07/24/2023  History of Present Illness  Lauren Grimes is a 67 y.o. female with medical history significant of high blood pressure and noncompliance.  This afternoon she noticed that her speech was very slurred.  She also had weakness in her right arm and unsteadiness when she tried to get up and walk.  She denies any dizziness or headache or fever or chills.  No chest pain or shortness of breath either.  The symptoms did scare her and she came into the ER for evaluation.  She was initially seen as a code stroke neuro did not recommend tPA because her symptoms were mild and improving.  After a couple of hours in the emergency department the patient's symptoms worsened again and neuro was reconsulted.  They did not recommend tPA as her symptoms remained mild.  It is recommended however that she be admitted to have an MRI and full stroke workup.   Clinical Impression  Patient functioning near baseline for functional mobility and gait demonstrating good return for ambulating in room/hallways without loss of balance.  Plan:  Patient discharged from physical therapy to care of nursing for ambulation daily as tolerated for length of stay.          If plan is discharge home, recommend the following: Other (comment) (near baseline)   Can travel by private vehicle    yes    Equipment Recommendations None recommended by PT  Recommendations for Other Services       Functional Status Assessment Patient has not had a recent decline in their functional status     Precautions / Restrictions Precautions Precautions: None Restrictions Weight Bearing Restrictions: No      Mobility  Bed Mobility Overal bed mobility: Independent                  Transfers Overall transfer level: Independent                      Ambulation/Gait Ambulation/Gait assistance: Modified  independent (Device/Increase time) Gait Distance (Feet): 100 Feet Assistive device: None Gait Pattern/deviations: WFL(Within Functional Limits) Gait velocity: near normal     General Gait Details: grossly WFL with good return for ambulating in room, hallways without loss of balance  Stairs            Wheelchair Mobility     Tilt Bed    Modified Rankin (Stroke Patients Only)       Balance Overall balance assessment: Mild deficits observed, not formally tested                                           Pertinent Vitals/Pain Pain Assessment Pain Assessment: No/denies pain    Home Living Family/patient expects to be discharged to:: Private residence Living Arrangements: Spouse/significant other Available Help at Discharge: Family;Available 24 hours/day Type of Home: House         Home Layout: Laundry or work area in basement;Able to live on main level with bedroom/bathroom;Two level Home Equipment: Grab bars - tub/shower      Prior Function Prior Level of Function : Independent/Modified Independent                     Extremity/Trunk Assessment   Upper Extremity Assessment Upper Extremity Assessment: Defer to  OT evaluation    Lower Extremity Assessment Lower Extremity Assessment: Overall WFL for tasks assessed    Cervical / Trunk Assessment Cervical / Trunk Assessment: Normal  Communication   Communication Communication: No apparent difficulties  Cognition Arousal: Alert Behavior During Therapy: WFL for tasks assessed/performed Overall Cognitive Status: Within Functional Limits for tasks assessed                                          General Comments      Exercises     Assessment/Plan    PT Assessment Patient does not need any further PT services  PT Problem List         PT Treatment Interventions      PT Goals (Current goals can be found in the Care Plan section)  Acute Rehab PT  Goals Patient Stated Goal: return home PT Goal Formulation: With patient/family Time For Goal Achievement: 07/24/23 Potential to Achieve Goals: Good    Frequency       Co-evaluation PT/OT/SLP Co-Evaluation/Treatment: Yes Reason for Co-Treatment: To address functional/ADL transfers PT goals addressed during session: Mobility/safety with mobility;Balance OT goals addressed during session: ADL's and self-care       AM-PAC PT "6 Clicks" Mobility  Outcome Measure Help needed turning from your back to your side while in a flat bed without using bedrails?: None Help needed moving from lying on your back to sitting on the side of a flat bed without using bedrails?: None Help needed moving to and from a bed to a chair (including a wheelchair)?: None Help needed standing up from a chair using your arms (e.g., wheelchair or bedside chair)?: None Help needed to walk in hospital room?: None Help needed climbing 3-5 steps with a railing? : None 6 Click Score: 24    End of Session   Activity Tolerance: Patient tolerated treatment well Patient left: in bed;with call bell/phone within reach Nurse Communication: Mobility status PT Visit Diagnosis: Unsteadiness on feet (R26.81);Other abnormalities of gait and mobility (R26.89);Muscle weakness (generalized) (M62.81)    Time: 0802-0822 PT Time Calculation (min) (ACUTE ONLY): 20 min   Charges:   PT Evaluation $PT Eval Moderate Complexity: 1 Mod PT Treatments $Therapeutic Activity: 8-22 mins PT General Charges $$ ACUTE PT VISIT: 1 Visit         12:25 PM, 07/24/23 Ocie Bob, MPT Physical Therapist with [facility] 336 [phone] office [phone] mobile phone

## 2023-07-24 NOTE — Care Management CC44 (Signed)
Condition Code 44 Documentation Completed  Patient Details  Name: ZAREA SMART MRN: [id number] Date of Birth: 22-Jun-1956   Condition Code 44 given:  Yes Patient signature on Condition Code 44 notice:  Yes Documentation of 2 MD's agreement:  Yes Code 44 added to claim:  Yes    Villa Herb, LCSWA 07/24/2023, 4:50 PM

## 2023-07-28 LAB — ALDOSTERONE + RENIN ACTIVITY W/ RATIO
ALDO / PRA Ratio: >35.3 — ABNORMAL HIGH (ref 0.0–30.0)
Aldosterone: 5.9 ng/dL (ref 0.0–30.0)
PRA LC/MS/MS: <0.167 ng/mL/h — ABNORMAL LOW (ref 0.167–5.380)

## 2023-08-24 ENCOUNTER — Encounter: Payer: Self-pay | Admitting: Physician Assistant

## 2023-09-02 NOTE — Progress Notes (Signed)
[facility] Neurology Division Clinic Note - Initial Visit   Date: 09/05/23  Lauren Grimes MRN: [id number] DOB: 09-15-1956   Acute ischemic stroke Patient presented on 07/23/2023 with acute R sided facial droop, R arm weakness, and slurred speech. She did not receive tPA as the symptoms were mild improving on presentation MRI brain without contrast 07/23/2023 remarkable for small focus of acute/early subacute ischemia of the left caudate. 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  V3 segment left vertebral artery occlusion was noted . She was diagnosed by  IP Neuro with acute ischemic stroke. Placed on Asa 81 mg daily and Plavix 75 mg daily for 3 months and then Asa 81 mg daily  indefinitely, Lipitor 40 mg daily for goal LDL <70 and antihypertensives for secondary stroke  prevention. She has not had any recurrent symptoms, but again has not been taking her antilipid medicines. She has been educated of the importance of adherence to the regimen in an effort to prevent further events.   Plan:   Complete workup with 30 day Holter monitor Continue Lipitor 40 mg daily, antihypertensives  Continue ASA and Plavix for total of 3 months, then ASA alone  Recommend good control of cardiovascular risk factors.   Follow up in 6 months  She has been educated of the importance of adherence to the regimen in an effort to prevent further events    History of Present Illness:   67 yo RH female with a history of HTN with hypertensive emergency in 2019, non compliant with meds, presenting on 07/23/2023 with acute R sided facial droop, R arm weakness, and slurred speech. She did not receive tPA as the symptoms were mild improving on presentation. MRI brain without contrast 07/23/2023 remarkable for  small focus of acute/early subacute ischemia of the left caudate. 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  V3 segment left vertebral artery occlusion was noted . She was diagnosed by  IP Neuro with acute ischemic stroke. Placed on Asa 81 mg daily and Plavix 75 mg daily for 3 months and then Asa 81 mg daily  indefinitely, Lipitor 40 mg daily for goal LDL <70 and antihypertensives for secondary stroke  prevention. She has not had any recurrent symptoms, but again has not been taking her antilipid medicines. She has been educated of the importance of adherence to the regimen in an effort to prevent further events.   Patient  never have had a similar episode till 07/2023.  Denies vertigo dizziness or vision changes. Denies headaches, dysarthria or dysphagia. No confusion or seizures. Denies any chest pain, or shortness of breath. Denies any fever or chills, or night sweats. No tobacco. No new meds or hormonal supplements. Priorly she was not on blood thinners . Denies any recent long distance trips or recent surgeries. No sick contacts. No new stressors present in personal life.  Patient is compliant with his medications. Patient is not very active . She  is not a diabetic. No family history of stroke.  Patient was not administered TPA  as is beyond time window for treatment consideration.    Out-side paper  records, electronic medical record, and images have been personally reviewed where available and summarized as:    CT head without contrast 07/23/2023: No acute abnormality.  ASPECTS 10    MRI brain without contrast 07/23/2023:Small focus of acute/early subacute ischemia of the left caudate,close to the posterior limb of the left internal capsule.   MRA head without contrast and MRA neck with and  without contrast 07/23/2023: Severe stenosis of the distal azygos A2 branch of the anterior cerebral artery. Severe stenosis of the distal left MCA M1 segment. Loss of contrast enhancement of the left vertebral artery at the V3 segment, consistent with occlusion.  2 D echo was remarkable for nl EF and gr 1 DD  Lab Results  Component Value Date   HGBA1C 5.1 07/23/2023   Lab Results  Component Value Date   VITAMINB12 474 10/18/2017   Lab Results  Component Value Date   TSH 2.585 07/24/2023   Lipid Panel     Component Value Date/Time   CHOL 239 (H) 07/24/2023 0439   TRIG 91 07/24/2023 0439   HDL 47 07/24/2023 0439   CHOLHDL 5.1 07/24/2023 0439   VLDL 18 07/24/2023 0439   LDLCALC 174 (H) 07/24/2023 0439    No results found for: "ESRSEDRATE", "POCTSEDRATE"  Past Medical History:  Diagnosis Date   Hypertension     History reviewed. No pertinent surgical history.   Medications:  Outpatient Encounter Medications as of 09/05/2023  Medication Sig   amLODipine (NORVASC) 10 MG tablet Take 1 tablet (10 mg total) by mouth daily.   aspirin 81 MG chewable tablet Chew 1 tablet (81 mg total) by mouth daily.   atorvastatin (LIPITOR) 40 MG tablet Take 1 tablet (40 mg total) by mouth daily.   BIOTIN PO Take 1 tablet by mouth daily.   clopidogrel (PLAVIX) 75 MG tablet Take 1 tablet (75 mg total) by mouth daily.   COLLAGEN PO Take 1 Scoop by mouth daily.   Cyanocobalamin (VITAMIN B 12 PO) Take 1 tablet by mouth daily.   losartan (COZAAR) 50 MG tablet Take 1 tablet (50 mg total) by mouth daily.   Turmeric (QC TUMERIC COMPLEX PO) Take 1 tablet by mouth daily.   VITAMIN D PO Take 1 tablet by mouth daily at 12 noon.   No facility-administered encounter medications on file as of 09/05/2023.    Allergies:  Allergies  Allergen Reactions   Other Cough    Family History: History reviewed. No pertinent family history.  Social History: Social History   Tobacco Use   Smoking status:  Never   Smokeless tobacco: Never  Substance Use Topics   Alcohol use: No   Drug use: No   Social History   Social History Narrative   Right handed   Prn caffeine   Lives with husband   One child   One story home    Vital Signs:  BP (!) 149/82   Pulse 85   Resp 20   Ht 5' 7.5" (1.715 m)   Wt 229 lb (103.9 kg)   SpO2 95%   BMI 35.34 kg/m    General Medical Exam:   General:  Well appearing, comfortable.   Eyes/ENT: see cranial nerve examination.   Neck:   No carotid bruits. Respiratory:  Clear to auscultation, good air entry bilaterally.   Cardiac:  Regular rate and rhythm, soft 1/6 SM murmur.   Extremities:  No deformities, edema, or skin discoloration.  Skin:  No rashes or lesions.  Neurological Exam: MENTAL  STATUS including orientation to time, place, person, recent and remote memory, attention span and concentration, language, and fund of knowledge is normal.  Speech is not dysarthric.  CRANIAL NERVES: II:  No visual field defects.  Unremarkable fundi.   III-IV-VI: Pupils equal round and reactive to light.  Normal conjugate, extra-ocular eye movements in all directions of gaze.  No nystagmus.  No ptosis .   V:  Normal facial sensation.    VII:  Normal facial symmetry and movements.   VIII:  Normal hearing and vestibular function.   IX-X:  Normal palatal movement.   XI:  Normal shoulder shrug and head rotation.   XII:  Normal tongue strength and range of motion, no deviation or fasciculation.  MOTOR:  No atrophy, fasciculations or abnormal movements.  No pronator drift.    SENSORY:  Normal and symmetric perception of light touch, pinprick, vibration, and proprioception.  Romberg's sign absent.   COORDINATION/GAIT: Normal finger-to- nose-finger and heel-to-shin.  Intact rapid alternating movements bilaterally.  Able to rise from a chair without using arms.  Gait narrow based and stable. Tandem and stressed gait intact.     Total time spent:  48 mins    Thank  you for allowing me to participate in patient's care.  If I can answer any additional questions, I would be pleased to do so.    Sincerely,   Marlowe Kays, PA-C

## 2023-09-05 ENCOUNTER — Ambulatory Visit: Payer: Medicare (Managed Care) | Admitting: Physician Assistant

## 2023-09-05 ENCOUNTER — Other Ambulatory Visit: Payer: Self-pay

## 2023-09-05 ENCOUNTER — Encounter: Payer: Self-pay | Admitting: Physician Assistant

## 2023-09-05 VITALS — BP 149/82 | HR 85 | Resp 20 | Ht 67.5 in | Wt 229.0 lb

## 2023-09-05 DIAGNOSIS — I639 Cerebral infarction, unspecified: Secondary | ICD-10-CM | POA: Diagnosis not present

## 2023-09-05 DIAGNOSIS — R9089 Other abnormal findings on diagnostic imaging of central nervous system: Secondary | ICD-10-CM | POA: Diagnosis not present

## 2023-09-05 NOTE — Patient Instructions (Signed)
Referral to Interventional radiology 30 day Holter monitor Follow up in 6 months

## 2023-09-12 DIAGNOSIS — I639 Cerebral infarction, unspecified: Secondary | ICD-10-CM | POA: Diagnosis not present

## 2023-09-12 DIAGNOSIS — R9089 Other abnormal findings on diagnostic imaging of central nervous system: Secondary | ICD-10-CM

## 2023-09-18 ENCOUNTER — Other Ambulatory Visit (HOSPITAL_COMMUNITY): Payer: Self-pay | Admitting: Neuroradiology

## 2023-09-18 DIAGNOSIS — I771 Stricture of artery: Secondary | ICD-10-CM

## 2023-10-01 ENCOUNTER — Ambulatory Visit (HOSPITAL_COMMUNITY): Payer: Medicare (Managed Care)

## 2023-10-08 ENCOUNTER — Other Ambulatory Visit (HOSPITAL_COMMUNITY): Payer: Medicare (Managed Care)

## 2023-10-16 ENCOUNTER — Ambulatory Visit: Payer: Medicare (Managed Care) | Attending: Physician Assistant

## 2023-10-16 DIAGNOSIS — R9089 Other abnormal findings on diagnostic imaging of central nervous system: Secondary | ICD-10-CM

## 2023-10-16 DIAGNOSIS — I639 Cerebral infarction, unspecified: Secondary | ICD-10-CM

## 2023-10-17 ENCOUNTER — Ambulatory Visit (HOSPITAL_COMMUNITY)
Admission: RE | Admit: 2023-10-17 | Discharge: 2023-10-17 | Disposition: A | Discharge status: Home / Self Care | Payer: Medicare (Managed Care) | Source: Ambulatory Visit | Attending: Neuroradiology | Admitting: Neuroradiology

## 2023-10-17 DIAGNOSIS — I771 Stricture of artery: Secondary | ICD-10-CM

## 2023-10-17 NOTE — Consult Note (Signed)
 Chief Complaint: Patient was seen in consultation today for intracranial atherosclerotic disease.  Supervising Physician: De Macedo Rodrigues, Clarita Mcelvain  Patient Status: [facility] - Out-pt  History of Present Illness: Lauren Grimes is a 68 year old woman presented to the emergency October 2024 with right-sided weakness and aphasia which resolved with conservative management.  She was admitted for stroke workup and MRA of the brain showed severe stenosis of the distal azygos A2 branch of the anterior cerebral artery, severe stenosis of the distal left MCA M1 segment and loss of contrast enhancement of the left vertebral artery at the V3 segment, consistent with occlusion.  She was referred to us  for a diagnostic cerebral angiogram.   Past Medical History:  Diagnosis Date   Hypertension     No past surgical history on file.  Allergies: Other  Medications: Prior to Admission medications   Medication Sig Start Date End Date Taking? Authorizing Provider  amLODipine  (NORVASC ) 10 MG tablet Take 1 tablet (10 mg total) by mouth daily. 07/24/23   Mason Sole, Pratik D, DO  aspirin  81 MG chewable tablet Chew 1 tablet (81 mg total) by mouth daily. 07/25/23   Mason Sole, Pratik D, DO  atorvastatin  (LIPITOR) 40 MG tablet Take 1 tablet (40 mg total) by mouth daily. 07/25/23   Mason Sole, Pratik D, DO  BIOTIN PO Take 1 tablet by mouth daily.    [provider]  clopidogrel  (PLAVIX ) 75 MG tablet Take 1 tablet (75 mg total) by mouth daily. 07/25/23 10/21/23  Mason Sole, Pratik D, DO  COLLAGEN PO Take 1 Scoop by mouth daily.    [provider]  Cyanocobalamin (VITAMIN B 12 PO) Take 1 tablet by mouth daily.    [provider]  losartan  (COZAAR ) 50 MG tablet Take 1 tablet (50 mg total) by mouth daily. 07/24/23   Mason Sole, Pratik D, DO  Turmeric (QC TUMERIC COMPLEX PO) Take 1 tablet by mouth daily.    [provider]  VITAMIN D PO Take 1 tablet by mouth daily at 12 noon.    [provider]      No family history on file.  Social History   Socioeconomic History   Marital status: Married    Spouse name: Not on file   Number of children: 1   Years of education: 12   Highest education level: Not on file  Occupational History   Not on file  Tobacco Use   Smoking status: Never   Smokeless tobacco: Never  Substance and Sexual Activity   Alcohol use: No   Drug use: No   Sexual activity: Not on file  Other Topics Concern   Not on file  Social History Narrative   Right handed   Prn caffeine   Lives with husband   One child   One story home   Social Drivers of Health   Financial Resource Strain: Not on file  Food Insecurity: Not on file  Transportation Needs: Not on file  Physical Activity: Not on file  Stress: Not on file  Social Connections: Not on file     Review of Systems: A 12 point ROS discussed and pertinent positives are indicated in the HPI above.  All other systems are negative.  Review of Systems  Vital Signs: There were no vitals taken for this visit.  Physical Exam Constitutional:      Appearance: Normal appearance.  HENT:     Head: Normocephalic and atraumatic.     Mouth/Throat:     Mouth: Mucous  membranes are moist.     Pharynx: Oropharynx is clear.  Eyes:     Extraocular Movements: Extraocular movements intact.     Conjunctiva/sclera: Conjunctivae normal.     Pupils: Pupils are equal, round, and reactive to light.  Neurological:     Mental Status: She is alert and oriented to person, place, and time.     Cranial Nerves: Cranial nerves 2-12 are intact.     Sensory: Sensation is intact.     Motor: Motor function is intact.     Coordination: Coordination is intact.     Gait: Gait is intact.          Imaging: No results found.  Labs:  CBC: Recent Labs    07/23/23 1229 07/24/23 0439  WBC 8.3 8.3  HGB 14.0 13.0  HCT 45.4 41.3  PLT 217 200    COAGS: Recent Labs    07/23/23 1229  INR 1.0  APTT 25     BMP: Recent Labs    07/23/23 1229 07/24/23 0439  NA 137 140  K 3.4* 3.3*  CL 103 107  CO2 27 26  GLUCOSE 110* 100*  BUN 14 12  CALCIUM  8.8* 8.8*  CREATININE 0.81 0.71  GFRNONAA >60 >60    LIVER FUNCTION TESTS: Recent Labs    07/23/23 1229  BILITOT 0.7  AST 17  ALT 17  ALKPHOS 73  PROT 7.7  ALBUMIN 4.1    TUMOR MARKERS: No results for input(s): "AFPTM", "CEA", "CA199", "CHROMGRNA" in the last 8760 hours.  Assessment and Plan:  Lauren Grimes made a great recover after her stroke with no residual symptoms.  She says she is taking her medications as prescribed.  We discussed findings of her MR angiogram from October 2024.  I explained risks and benefits of obtaining a diagnostic cerebral angiogram to evaluate her intracranial atherosclerotic disease.  She understood, but was unsure if she wanted to proceed.  She asked that we call her next week to check on her decision.  All questions were answered to her satisfaction.   Electronically Signed: Magen Suriano De Macedo Rodrigues, MD 10/17/2023, 1:39 PM   I spent a total of  30 Minutes   in face to face in clinical consultation, greater than 50% of which was counseling/coordinating care for intracranial atherosclerotic disease.

## 2024-01-09 ENCOUNTER — Telehealth: Payer: Self-pay | Admitting: Physician Assistant

## 2024-01-09 NOTE — Telephone Encounter (Signed)
 Entered in error

## 2024-03-05 ENCOUNTER — Ambulatory Visit: Payer: Medicare (Managed Care) | Admitting: Physician Assistant

## 2024-09-17 ENCOUNTER — Emergency Department (HOSPITAL_COMMUNITY)
Admission: EM | Admit: 2024-09-17 | Discharge: 2024-09-17 | Disposition: A | Discharge status: Home / Self Care | Payer: Medicare (Managed Care) | Attending: Emergency Medicine | Admitting: Emergency Medicine

## 2024-09-17 ENCOUNTER — Other Ambulatory Visit: Payer: Self-pay

## 2024-09-17 ENCOUNTER — Encounter (HOSPITAL_COMMUNITY): Payer: Self-pay

## 2024-09-17 DIAGNOSIS — Z79899 Other long term (current) drug therapy: Secondary | ICD-10-CM | POA: Diagnosis not present

## 2024-09-17 DIAGNOSIS — I10 Essential (primary) hypertension: Secondary | ICD-10-CM | POA: Diagnosis not present

## 2024-09-17 DIAGNOSIS — R10A1 Flank pain, right side: Secondary | ICD-10-CM | POA: Diagnosis present

## 2024-09-17 DIAGNOSIS — Z7982 Long term (current) use of aspirin: Secondary | ICD-10-CM | POA: Diagnosis not present

## 2024-09-17 LAB — URINALYSIS, ROUTINE W REFLEX MICROSCOPIC
Bilirubin Urine: NEGATIVE
Glucose, UA: NEGATIVE mg/dL
Hgb urine dipstick: NEGATIVE
Ketones, ur: NEGATIVE mg/dL
Leukocytes,Ua: NEGATIVE
Nitrite: NEGATIVE
Protein, ur: NEGATIVE mg/dL
Specific Gravity, Urine: 1.014 (ref 1.005–1.030)
pH: 6 (ref 5.0–8.0)

## 2024-09-17 LAB — BASIC METABOLIC PANEL WITH GFR
Anion gap: 10 (ref 5–15)
BUN: 20 mg/dL (ref 8–23)
CO2: 29 mmol/L (ref 22–32)
Calcium: 9.5 mg/dL (ref 8.9–10.3)
Chloride: 104 mmol/L (ref 98–111)
Creatinine, Ser: 0.82 mg/dL (ref 0.44–1.00)
GFR, Estimated: >60 mL/min (ref >60)
Glucose, Bld: 99 mg/dL (ref 70–99)
Potassium: 3.3 mmol/L — ABNORMAL LOW (ref 3.5–5.1)
Sodium: 142 mmol/L (ref 135–145)

## 2024-09-17 LAB — CBC
HCT: 43.8 % (ref 36.0–46.0)
Hemoglobin: 13.6 g/dL (ref 12.0–15.0)
MCH: 29.7 pg (ref 26.0–34.0)
MCHC: 31.1 g/dL (ref 30.0–36.0)
MCV: 95.6 fL (ref 80.0–100.0)
Platelets: 241 K/uL (ref 150–400)
RBC: 4.58 MIL/uL (ref 3.87–5.11)
RDW: 13.9 % (ref 11.5–15.5)
WBC: 9.5 K/uL (ref 4.0–10.5)
nRBC: 0 % (ref 0.0–0.2)

## 2024-09-17 MED ORDER — HYDROCODONE-ACETAMINOPHEN 5-325 MG PO TABS
1.0000 | ORAL_TABLET | Freq: Once | ORAL | Status: AC
  Administered 2024-09-17: 02:00:00 1 via ORAL
  Filled 2024-09-17: qty 1

## 2024-09-17 NOTE — ED Provider Notes (Signed)
 [place] EMERGENCY DEPARTMENT AT [facility] Provider Note   CSN: [id number] Arrival date & time: 09/17/24  9975     Patient presents with: Flank Pain (Right side)   Lauren Grimes is a 68 y.o. female.   The history is provided by the patient and the spouse.   Patient with history of hypertension presents for flank pain.  Patient reports she has had right sided flank pain for over the past day.  No falls or trauma.  It does not radiate into the abdomen.  The pain is worsening.  She denies dysuria, but is having urinary frequency and urgency.  No chest pain or shortness of breath.  No weakness.  No previous history of kidney stones   Past Medical History:  Diagnosis Date   Hypertension     Prior to Admission medications  Medication Sig Start Date End Date Taking? Authorizing Provider  amLODipine  (NORVASC ) 10 MG tablet Take 1 tablet (10 mg total) by mouth daily. 07/24/23   Maree, Pratik D, DO  aspirin  81 MG chewable tablet Chew 1 tablet (81 mg total) by mouth daily. 07/25/23   Maree, Pratik D, DO  atorvastatin  (LIPITOR) 40 MG tablet Take 1 tablet (40 mg total) by mouth daily. 07/25/23   Maree, Pratik D, DO  BIOTIN PO Take 1 tablet by mouth daily.    [provider]  COLLAGEN PO Take 1 Scoop by mouth daily.    [provider]  Cyanocobalamin  (VITAMIN B 12 PO) Take 1 tablet by mouth daily.    [provider]  losartan  (COZAAR ) 50 MG tablet Take 1 tablet (50 mg total) by mouth daily. 07/24/23   Maree, Pratik D, DO  Turmeric (QC TUMERIC COMPLEX PO) Take 1 tablet by mouth daily.    [provider]  VITAMIN D PO Take 1 tablet by mouth daily at 12 noon.    [provider]    Allergies: Other    Review of Systems  Constitutional:  Negative for fever.  Gastrointestinal:  Negative for vomiting.  Genitourinary:  Positive for flank pain.  Neurological:  Negative for weakness.    Updated Vital Signs BP (!) 154/91   Pulse 77   Temp  98.4 F (36.9 C) (Oral)   Resp 17   Ht 1.702 m (5' 7)   Wt 103.9 kg   SpO2 94%   BMI 35.88 kg/m   Physical Exam CONSTITUTIONAL: Well developed/well nourished, no distress HEAD: Normocephalic/atraumatic ENMT: Mucous membranes moist NECK: supple no meningeal signs SPINE/BACK:entire spine nontender CV: S1/S2 noted, no murmurs/rubs/gallops noted LUNGS: Lungs are clear to auscultation bilaterally, no apparent distress ABDOMEN: soft, nontender, no rebound or guarding, bowel sounds noted throughout abdomen GU: Right cva tenderness, no bruising, no erythema or rash NEURO: Pt is awake/alert/appropriate, moves all extremitiesx4.  No facial droop.  Full range of motion of both lower extremities difficulty Equal power noted in both lower extremities EXTREMITIES: pulses normal/equal in both feet, full ROM SKIN: warm, color normal PSYCH: no abnormalities of mood noted, alert and oriented to situation  (all labs ordered are listed, but only abnormal results are displayed) Labs Reviewed  BASIC METABOLIC PANEL WITH GFR - Abnormal; Notable for the following components:      Result Value   Potassium 3.3 (*)    All other components within normal limits  URINALYSIS, ROUTINE W REFLEX MICROSCOPIC  CBC    EKG: None  Radiology: No results found.   Procedures   Medications Ordered in the  ED  HYDROcodone -acetaminophen  (NORCO/VICODIN) 5-325 MG per tablet 1 tablet (1 tablet Oral Given 09/17/24 0142)    Clinical Course as of 09/17/24 0239  Wed Sep 17, 2024  0238 Patient feels much improved.  She is ambulatory Urinalysis negative. Pain was confined to the right flank only without any bruising or erythema.  No spinal tenderness.  No abdominal tenderness Offered further workup including CT imaging but patient reports feeling improved and would like to be discharged home. This appears to be a reasonable plan.  We discussed strict return precautions Advise she can take NSAIDs for the next week,  and follow-up with PCP if no improvement [DW]    Clinical Course User Index [DW] Midge Golas, MD                                 Medical Decision Making Amount and/or Complexity of Data Reviewed Labs: ordered.  Risk Prescription drug management.   This patient presents to the ED for concern of flank pain, this involves an extensive number of treatment options, and is a complaint that carries with it a high risk of complications and morbidity.  The differential diagnosis includes but is not limited to muscle strain, pyelonephritis, ureteral colic, renal abscess, renal hematoma, psoas abscess, AAA  Comorbidities that complicate the patient evaluation: Patients presentation is complicated by their history of hypertension  Additional history obtained: Records reviewed outpatient records reviewed  Lab Tests: I Ordered, and personally interpreted labs.  The pertinent results include: Lab overall reassuring  Medicines ordered and prescription drug management: I ordered medication including Vicodin and for pain Reevaluation of the patient after these medicines showed that the patient    improved  Test Considered: CT imaging was considered and discussed, patient declines  Reevaluation: After the interventions noted above, I reevaluated the patient and found that they have :improved  Complexity of problems addressed: Patients presentation is most consistent with  acute presentation with potential threat to life or bodily function  Disposition: After consideration of the diagnostic results and the patients response to treatment,  I feel that the patent would benefit from discharge  .        Final diagnoses:  Right flank pain    ED Discharge Orders     None          Midge Golas, MD 09/17/24 [phone]

## 2024-09-17 NOTE — ED Triage Notes (Signed)
 Pt to ED from home with c/o right sided flank pain that started mild this morning and got severe about an hour ago, pt also reports frequency and urgency.
